# Patient Record
Sex: Female | Born: 1955 | Race: White | Hispanic: No | Marital: Married | Smoking: Never smoker
Health system: Southern US, Community
[De-identification: ages and names within clinical notes are randomized; demographics above are authoritative.]

## PROBLEM LIST (undated history)

## (undated) DIAGNOSIS — I341 Nonrheumatic mitral (valve) prolapse: Secondary | ICD-10-CM

## (undated) DIAGNOSIS — R011 Cardiac murmur, unspecified: Secondary | ICD-10-CM

## (undated) DIAGNOSIS — K219 Gastro-esophageal reflux disease without esophagitis: Secondary | ICD-10-CM

## (undated) DIAGNOSIS — M1990 Unspecified osteoarthritis, unspecified site: Secondary | ICD-10-CM

## (undated) DIAGNOSIS — E785 Hyperlipidemia, unspecified: Secondary | ICD-10-CM

## (undated) DIAGNOSIS — I10 Essential (primary) hypertension: Secondary | ICD-10-CM

## (undated) DIAGNOSIS — Z90710 Acquired absence of both cervix and uterus: Secondary | ICD-10-CM

## (undated) HISTORY — DX: Gastro-esophageal reflux disease without esophagitis: K21.9

## (undated) HISTORY — DX: Essential (primary) hypertension: I10

## (undated) HISTORY — DX: Nonrheumatic mitral (valve) prolapse: I34.1

## (undated) HISTORY — DX: Hyperlipidemia, unspecified: E78.5

## (undated) HISTORY — DX: Unspecified osteoarthritis, unspecified site: M19.90

## (undated) HISTORY — DX: Acquired absence of both cervix and uterus: Z90.710

## (undated) HISTORY — DX: Cardiac murmur, unspecified: R01.1

## (undated) HISTORY — PX: CHOLECYSTECTOMY: SHX55

---

## 1993-08-25 DIAGNOSIS — Z90710 Acquired absence of both cervix and uterus: Secondary | ICD-10-CM

## 1993-08-25 HISTORY — DX: Acquired absence of both cervix and uterus: Z90.710

## 1993-08-25 HISTORY — PX: ABDOMINAL HYSTERECTOMY: SHX81

## 2007-05-04 ENCOUNTER — Ambulatory Visit: Payer: Self-pay | Admitting: Family Medicine

## 2007-05-04 DIAGNOSIS — N951 Menopausal and female climacteric states: Secondary | ICD-10-CM

## 2007-05-04 DIAGNOSIS — I341 Nonrheumatic mitral (valve) prolapse: Secondary | ICD-10-CM | POA: Insufficient documentation

## 2007-05-04 DIAGNOSIS — I10 Essential (primary) hypertension: Secondary | ICD-10-CM | POA: Insufficient documentation

## 2007-05-13 ENCOUNTER — Encounter: Admission: RE | Admit: 2007-05-13 | Discharge: 2007-05-13 | Payer: Self-pay | Admitting: Family Medicine

## 2007-07-05 ENCOUNTER — Telehealth: Payer: Self-pay | Admitting: Family Medicine

## 2007-07-20 ENCOUNTER — Encounter: Payer: Self-pay | Admitting: Family Medicine

## 2007-07-20 LAB — CONVERTED CEMR LAB
CO2: 20 meq/L (ref 19–32)
Cholesterol: 246 mg/dL — ABNORMAL HIGH (ref 0–200)
Creatinine, Ser: 0.71 mg/dL (ref 0.40–1.20)
Glucose, Bld: 89 mg/dL (ref 70–99)
HDL: 45 mg/dL (ref >39)
Total Bilirubin: 1.3 mg/dL — ABNORMAL HIGH (ref 0.3–1.2)
Total CHOL/HDL Ratio: 5.5
Triglycerides: 205 mg/dL — ABNORMAL HIGH (ref <150)
VLDL: 41 mg/dL — ABNORMAL HIGH (ref 0–40)

## 2007-07-26 ENCOUNTER — Ambulatory Visit: Payer: Self-pay | Admitting: Family Medicine

## 2007-07-26 DIAGNOSIS — E785 Hyperlipidemia, unspecified: Secondary | ICD-10-CM | POA: Insufficient documentation

## 2007-12-13 ENCOUNTER — Ambulatory Visit: Payer: Self-pay | Admitting: Family Medicine

## 2007-12-13 DIAGNOSIS — K589 Irritable bowel syndrome without diarrhea: Secondary | ICD-10-CM | POA: Insufficient documentation

## 2007-12-14 LAB — CONVERTED CEMR LAB
Albumin: 4.6 g/dL (ref 3.5–5.2)
BUN: 19 mg/dL (ref 6–23)
Calcium: 9.6 mg/dL (ref 8.4–10.5)
Chloride: 104 meq/L (ref 96–112)
Glucose, Bld: 99 mg/dL (ref 70–99)
HDL: 41 mg/dL (ref >39)
Potassium: 3.8 meq/L (ref 3.5–5.3)
Triglycerides: 216 mg/dL — ABNORMAL HIGH (ref <150)

## 2008-03-03 ENCOUNTER — Ambulatory Visit: Payer: Self-pay | Admitting: Family Medicine

## 2008-06-19 ENCOUNTER — Ambulatory Visit: Payer: Self-pay | Admitting: Family Medicine

## 2008-09-19 ENCOUNTER — Ambulatory Visit: Payer: Self-pay | Admitting: Family Medicine

## 2008-09-19 LAB — CONVERTED CEMR LAB: LDL Goal: 130 mg/dL

## 2008-09-20 LAB — CONVERTED CEMR LAB
ALT: 31 units/L (ref 0–35)
Albumin: 4.6 g/dL (ref 3.5–5.2)
CO2: 23 meq/L (ref 19–32)
Calcium: 9.7 mg/dL (ref 8.4–10.5)
Chloride: 101 meq/L (ref 96–112)
Creatinine, Ser: 0.83 mg/dL (ref 0.40–1.20)
Potassium: 4.1 meq/L (ref 3.5–5.3)
Triglycerides: 258 mg/dL — ABNORMAL HIGH (ref <150)

## 2008-10-30 ENCOUNTER — Encounter: Admission: RE | Admit: 2008-10-30 | Discharge: 2008-10-30 | Payer: Self-pay | Admitting: Family Medicine

## 2009-09-20 ENCOUNTER — Ambulatory Visit: Payer: Self-pay | Admitting: Family Medicine

## 2009-09-21 ENCOUNTER — Encounter (INDEPENDENT_AMBULATORY_CARE_PROVIDER_SITE_OTHER): Payer: Self-pay | Admitting: *Deleted

## 2009-09-21 LAB — CONVERTED CEMR LAB
ALT: 24 units/L (ref 0–35)
AST: 22 units/L (ref 0–37)
Alkaline Phosphatase: 56 units/L (ref 39–117)
Calcium: 9.9 mg/dL (ref 8.4–10.5)
Chloride: 105 meq/L (ref 96–112)
Creatinine, Ser: 0.77 mg/dL (ref 0.40–1.20)
HDL: 41 mg/dL (ref >39)
LDL Cholesterol: 120 mg/dL — ABNORMAL HIGH (ref 0–99)
TSH: 2.082 microintl units/mL (ref 0.350–4.500)
Total Bilirubin: 1.2 mg/dL (ref 0.3–1.2)
Total CHOL/HDL Ratio: 5.1
VLDL: 50 mg/dL — ABNORMAL HIGH (ref 0–40)
Vit D, 25-Hydroxy: 26 ng/mL — ABNORMAL LOW (ref 30–89)

## 2010-01-02 ENCOUNTER — Encounter: Payer: Self-pay | Admitting: Family Medicine

## 2010-03-18 ENCOUNTER — Ambulatory Visit: Payer: Self-pay | Admitting: Family Medicine

## 2010-03-18 DIAGNOSIS — R110 Nausea: Secondary | ICD-10-CM

## 2010-08-22 ENCOUNTER — Telehealth: Payer: Self-pay | Admitting: Family Medicine

## 2010-08-27 ENCOUNTER — Telehealth: Payer: Self-pay | Admitting: Family Medicine

## 2010-09-24 NOTE — Letter (Signed)
Summary: Out of Work  [facility]  [address], [address]   [place], [place] [address]   Phone: [phone]  Fax: [phone]    March 18, 2010   Employee:  Lorelee Cover    To Whom It May Concern:   For Medical reasons, please excuse the above named employee from work for the following dates:  Start:   03-17-2010  End:   03-19-2010  If you need additional information, please feel free to contact our office.         Sincerely,    Nani Gasser MD

## 2010-09-24 NOTE — Letter (Signed)
Summary: Primary Care Consult Scheduled Letter  [place] [facility]  [address]   [place], [place] [address]   Phone: [phone]  Fax: [phone]      09/21/2009 MRN: [id number]  [facility] [address] [place], [place]  [address]    Dear Ms. Ventrella,      We have scheduled an appointment for you.  At the recommendation of Dr.METHENEY , we have scheduled you a consult with [facility], [place]  DR Jason Fila FOR YOU COLONOSCOPY  on December 13 2009 at 8AM.  Their address is_[address], [place] . The office phone number is [phone].  If this appointment day and time is not convenient for you, please feel free to call the office of the doctor you are being referred to at the number listed above and reschedule the appointment.     It is important for you to keep your scheduled appointments. We are here to make sure you are given good patient care. If you have questions or you have made changes to your appointment, please notify us at  [phone], ask for HELEN.    Thank you,  Patient Care Coordinator [place] [facility]

## 2010-09-24 NOTE — Letter (Signed)
Summary: Patient Cancellation/[facility]  Patient Cancellation/[facility]   Imported By: Lanelle Bal 02/21/2010 08:30:36  _____________________________________________________________________  External Attachment:    Type:   Image     Comment:   External Document

## 2010-09-24 NOTE — Assessment & Plan Note (Signed)
Summary: HTN, nausea   Vital Signs:  Patient profile:   55 year old female Height:      64 inches Weight:      220 pounds Pulse rate:   78 / minute BP sitting:   153 / 91  (left arm) Cuff size:   large  Vitals Entered By: Kathlene November (March 18, 2010 1:24 PM)  Serial Vital Signs/Assessments:  Time      Position  BP       Pulse  Resp  Temp     By 1:35 PM             132/90                         Nani Gasser MD   CC: check up BP   Primary Care Provider:  Linford Arnold, C  CC:  check up BP.  History of Present Illness: Has been very stressed and then on Sunday was nauseated  so missed work. No fever or abdominal problems.   Works in a Surveyor, mining. Needs a doctors note.  Nausea is better today. Trying to stay hydrated. Took BP pills last night.  Does have a hx of IBS. No diarrhea.    BP med well tolerated. No SE.  Takes regularly.   Current Medications (verified): 1)  Lisinopril-Hydrochlorothiazide 10-12.5 Mg  Tabs (Lisinopril-Hydrochlorothiazide) .... Take One Tablet By Mouth Once A Day 2)  Propranolol Hcl Cr 80 Mg Xr24h-Cap (Propranolol Hcl) .... Take 1 Tablet By Mouth Once A Day 3)  Calcium 500/d 500-200 Mg-Unit  Tabs (Calcium Carbonate-Vitamin D) .... Take 1 Tablet By Mouth Two Times A Day 4)  Multivitamins   Tabs (Multiple Vitamin) .... Take 1 Tablet By Mouth Once A Day 5)  Simvastatin 40 Mg Tabs (Simvastatin) .... Take 1 Tablet By Mouth Once A Day At Bedtime 6)  Eql Fish Oil 1000 Mg  Caps (Omega-3 Fatty Acids) .... 2 Tabs Two Times A Day  Allergies (verified): No Known Drug Allergies  Comments:  Nurse/Medical Assistant: The patient's medications and allergies were reviewed with the patient and were updated in the Medication and Allergy Lists. Kathlene November (March 18, 2010 1:25 PM)  Physical Exam  General:  Well-developed,well-nourished,in no acute distress; alert,appropriate and cooperative throughout examination Head:  Normocephalic and atraumatic without obvious  abnormalities. No apparent alopecia or balding. Lungs:  Normal respiratory effort, chest expands symmetrically. Lungs are clear to auscultation, no crackles or wheezes. Heart:  Normal rate and regular rhythm. S1 and S2 normal without gallop, murmur, click, rub or other extra sounds. Abdomen:  Bowel sounds positive,abdomen soft and non-tender without masses, organomegaly or hernias noted. Skin:  no rashes.   Cervical Nodes:  No lymphadenopathy noted Psych:  Cognition and judgment appear intact. Alert and cooperative with normal attention span and concentration. No apparent delusions, illusions, hallucinations   Impression & Recommendations:  Problem # 1:  HYPERTENSION, BENIGN ESSENTIAL (ICD-401.1) Repeat BP much better. Recheck in 3 months since systolic a little higher than recommended.  Work on stress levels. Continue to work on diet and exercise.  Her updated medication list for this problem includes:    Lisinopril-hydrochlorothiazide 10-12.5 Mg Tabs (Lisinopril-hydrochlorothiazide) .Marland Kitchen... Take one tablet by mouth once a day    Propranolol Hcl Cr 80 Mg Xr24h-cap (Propranolol hcl) .Marland Kitchen... Take 1 tablet by mouth once a day  BP today: 153/91 Prior BP: 125/78 (09/20/2009)  Prior 10 Yr Risk Heart Disease: 7 % (09/19/2008)  Labs  Reviewed: K+: 4.0 (09/20/2009) Creat: : 0.77 (09/20/2009)   Chol: 211 (09/20/2009)   HDL: 41 (09/20/2009)   LDL: 120 (09/20/2009)   TG: 249 (09/20/2009)  Problem # 2:  NAUSEA (ICD-787.02) sxs resolved. Normal exam today. Work note given.   Complete Medication List: 1)  Lisinopril-hydrochlorothiazide 10-12.5 Mg Tabs (Lisinopril-hydrochlorothiazide) .... Take one tablet by mouth once a day 2)  Propranolol Hcl Cr 80 Mg Xr24h-cap (Propranolol hcl) .... Take 1 tablet by mouth once a day 3)  Calcium 500/d 500-200 Mg-unit Tabs (Calcium carbonate-vitamin d) .... Take 1 tablet by mouth two times a day 4)  Multivitamins Tabs (Multiple vitamin) .... Take 1 tablet by mouth once  a day 5)  Simvastatin 40 Mg Tabs (Simvastatin) .... Take 1 tablet by mouth once a day at bedtime 6)  Eql Fish Oil 1000 Mg Caps (Omega-3 fatty acids) .... 2 tabs two times a day  Patient Instructions: 1)  Please schedule a follow-up appointment in 3 months to recheck BP.

## 2010-09-24 NOTE — Assessment & Plan Note (Signed)
Summary: CPE, no pap   Vital Signs:  Patient profile:   55 year old female Height:      64 inches Weight:      220 pounds BMI:     37.90 Pulse rate:   68 / minute BP sitting:   125 / 78  (left arm) Cuff size:   large  Vitals Entered By: Kathlene November (September 20, 2009 10:35 AM) CC: CPE no pap   CC:  CPE no pap.  Current Medications (verified): 1)  Lisinopril-Hydrochlorothiazide 10-12.5 Mg  Tabs (Lisinopril-Hydrochlorothiazide) .... Take One Tablet By Mouth Once A Day 2)  Propranolol Hcl Cr 80 Mg Xr24h-Cap (Propranolol Hcl) .... Take 1 Tablet By Mouth Once A Day 3)  Calcium 500/d 500-200 Mg-Unit  Tabs (Calcium Carbonate-Vitamin D) .... Take 1 Tablet By Mouth Two Times A Day 4)  Multivitamins   Tabs (Multiple Vitamin) .... Take 1 Tablet By Mouth Once A Day 5)  Simvastatin 20 Mg  Tabs (Simvastatin) .... Take 1 Tablet By Mouth Once A Day At Bedtime 6)  Eql Fish Oil 1000 Mg  Caps (Omega-3 Fatty Acids) .... 2 Tabs Two Times A Day  Allergies (verified): No Known Drug Allergies  Comments:  Nurse/Medical Assistant: The patient's medications and allergies were reviewed with the patient and were updated in the Medication and Allergy Lists. Kathlene November (September 20, 2009 10:36 AM)  Past History:  Past Surgical History: Last updated: 05/04/2007 Complete Hysterectomy 1995 secondary to fibroid tumors Cholecystectomy April 26, 1994  Family History: Mother died at 67 ofblood clots from birth control Aunts with DM Sister with cholesterol, non alcoholic fatty cirrhosis or the liver.  Brother died age 6 from drug overdose.  Father with prostate cancer.   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  Married to [place].  No children.  Husband is a smoker.  Never Smoked Alcohol use-yes Drug use-no Regular exercise-no  Review of Systems  The patient denies anorexia, fever, weight loss, weight gain, vision loss, decreased hearing, hoarseness, chest pain, syncope, dyspnea  on exertion, peripheral edema, prolonged cough, headaches, hemoptysis, abdominal pain, melena, hematochezia, severe indigestion/heartburn, hematuria, incontinence, genital sores, muscle weakness, suspicious skin lesions, transient blindness, difficulty walking, depression, unusual weight change, abnormal bleeding, enlarged lymph nodes, and breast masses.    Physical Exam  General:  Well-developed,well-nourished,in no acute distress; alert,appropriate and cooperative throughout examination Head:  Normocephalic and atraumatic without obvious abnormalities. No apparent alopecia or balding. Eyes:  No corneal or conjunctival inflammation noted. EOMI. Perrla. Ears:  External ear exam shows no significant lesions or deformities.  Otoscopic examination reveals clear canals, tympanic membranes are intact bilaterally without bulging, retraction, inflammation or discharge. Hearing is grossly normal bilaterally. Nose:  External nasal examination shows no deformity or inflammation.  Mouth:  Oral mucosa and oropharynx without lesions or exudates.  Teeth in good repair. Neck:  No deformities, masses, or tenderness noted. Breasts:  No mass, nodules, thickening, tenderness, bulging, retraction, inflamation, nipple discharge or skin changes noted.   Lungs:  Normal respiratory effort, chest expands symmetrically. Lungs are clear to auscultation, no crackles or wheezes. Heart:  Normal rate and regular rhythm. S1 and S2 normal without gallop, murmur, click, rub or other extra sounds. Abdomen:  Bowel sounds positive,abdomen soft and non-tender without masses, organomegaly or hernias noted. Msk:  No deformity or scoliosis noted of thoracic or lumbar spine.   Pulses:  R and L carotid,radial,dorsalis pedis and posterior tibial pulses are full and equal bilaterally Extremities:  No  clubbing, cyanosis, edema, or deformity noted with normal full range of motion of all joints.   Neurologic:  No cranial nerve deficits noted.  Station and gait are normal.  DTRs are symmetrical throughout. Sensory, motor and coordinative functions appear intact. Skin:  no rashes.   Cervical Nodes:  No lymphadenopathy noted Axillary Nodes:  No palpable lymphadenopathy Psych:  Cognition and judgment appear intact. Alert and cooperative with normal attention span and concentration. No apparent delusions, illusions, hallucinations   Impression & Recommendations:  Problem # 1:  ROUTINE GENERAL MEDICAL EXAM@HEALTH  CARE FACL (ICD-V70.0) DEclined flu vaccine. Others are up to date.  Will refer to [facility] for colonoscopy for colon Ca Screening.  Due for screenign labs Refilled her BP medication Encouraged more regular exericise.  Orders: T-Comprehensive Metabolic Panel [phone]) T-Lipid Profile [phone]) T-TSH [phone]) T-Vitamin D (25-Hydroxy) [phone])  Complete Medication List: 1)  Lisinopril-hydrochlorothiazide 10-12.5 Mg Tabs (Lisinopril-hydrochlorothiazide) .... Take one tablet by mouth once a day 2)  Propranolol Hcl Cr 80 Mg Xr24h-cap (Propranolol hcl) .... Take 1 tablet by mouth once a day 3)  Calcium 500/d 500-200 Mg-unit Tabs (Calcium carbonate-vitamin d) .... Take 1 tablet by mouth two times a day 4)  Multivitamins Tabs (Multiple vitamin) .... Take 1 tablet by mouth once a day 5)  Simvastatin 20 Mg Tabs (Simvastatin) .... Take 1 tablet by mouth once a day at bedtime 6)  Eql Fish Oil 1000 Mg Caps (Omega-3 fatty acids) .... 2 tabs two times a day  Other Orders: Gastroenterology Referral (GI)  Patient Instructions: 1)  Dont forget you are due for your mammogram in March. 2)  Labs need to be fasting.  3)  Take calcium +vitamin D daily.  4)  It is important that you exercise reguarly at least 20 minutes 5 times a week. If you develop chest pain, have severe difficulty breathing, or feel very tired, stop exercising immediately and seek medical attention.  Prescriptions: SIMVASTATIN 20 MG   TABS (SIMVASTATIN) Take 1 tablet by mouth once a day at bedtime  #90 x 3   Entered and Authorized by:   Nani Gasser MD   Signed by:   Nani Gasser MD on 09/20/2009   Method used:   Electronically to        [facility] [phone]* (retail)       [address]       [place], [place]  [address]       Ph: [phone]       Fax: [phone]   RxID:   [id number] PROPRANOLOL HCL CR 80 MG XR24H-CAP (PROPRANOLOL HCL) Take 1 tablet by mouth once a day  #90 x 3   Entered and Authorized by:   Nani Gasser MD   Signed by:   Nani Gasser MD on 09/20/2009   Method used:   Electronically to        [facility] [phone]* (retail)       [address]       [place], [place]  [address]       Ph: [phone]       Fax: [phone]   RxID:   [id number] LISINOPRIL-HYDROCHLOROTHIAZIDE 10-12.5 MG  TABS (LISINOPRIL-HYDROCHLOROTHIAZIDE) Take one tablet by mouth once a day  #90 x 3   Entered and Authorized by:   Nani Gasser MD   Signed by:   Nani Gasser MD on 09/20/2009   Method used:   Electronically to        [facility] [phone]* (retail)       [address] [place], [place]  [address]       Ph: [phone]       Fax: [phone]   RxID:   [id number]   Flu Vaccine Next Due:  Refused TD Result Date:  08/26/2003 TD Result:  given TD Next Due:  10 yr PAP Next Due:  Not Indicated

## 2010-09-26 NOTE — Progress Notes (Signed)
  Phone Note Refill Request Message from:  Pharmacy on August 27, 2010 11:54 AM  Refills Requested: Medication #1:  LISINOPRIL-HYDROCHLOROTHIAZIDE 10-12.5 MG  TABS Take one tablet by mouth once a day   Supply Requested: 3 months Denied. Pt needs a f/u appt. Just sent rx 08/22/10.Pt has not been seen since 7/11  Initial call taken by: Avon Gully CMA, Duncan Dull),  August 27, 2010 11:54 AM

## 2010-09-26 NOTE — Progress Notes (Signed)
  Phone Note Refill Request Message from:  Pharmacy on August 22, 2010 2:34 PM  Refills Requested: Medication #1:  LISINOPRIL-HYDROCHLOROTHIAZIDE 10-12.5 MG  TABS Take one tablet by mouth once a day will send 30 to pharm but pt needs a f/u appt. has not been seen since 7/11   Method Requested: Fax to Local Pharmacy Initial call taken by: Avon Gully CMA, Duncan Dull),  August 22, 2010 2:38 PM Caller: [facility] [phone]*    Prescriptions: LISINOPRIL-HYDROCHLOROTHIAZIDE 10-12.5 MG  TABS (LISINOPRIL-HYDROCHLOROTHIAZIDE) Take one tablet by mouth once a day  #30 x 0   Entered by:   Avon Gully CMA, (AAMA)   Authorized by:   Nani Gasser MD   Signed by:   Avon Gully CMA, (AAMA) on 08/22/2010   Method used:   Electronically to        [facility] [phone]* (retail)       [address] [place], [place]  [address]       Ph: [phone]       Fax: [phone]   RxID:   [id number]

## 2010-12-05 ENCOUNTER — Other Ambulatory Visit: Payer: Self-pay | Admitting: Family Medicine

## 2010-12-05 ENCOUNTER — Other Ambulatory Visit: Payer: Self-pay | Admitting: *Deleted

## 2010-12-05 MED ORDER — PROPRANOLOL HCL ER 80 MG PO CP24: 80.0000 mg | ORAL_CAPSULE | Freq: Every day | ORAL | Status: DC

## 2010-12-05 MED ORDER — LISINOPRIL-HYDROCHLOROTHIAZIDE 10-12.5 MG PO TABS: 1.0000 | ORAL_TABLET | Freq: Every day | ORAL | Status: DC

## 2010-12-05 MED ORDER — SIMVASTATIN 20 MG PO TABS: 20.0000 mg | ORAL_TABLET | Freq: Every evening | ORAL | Status: DC

## 2011-01-02 ENCOUNTER — Ambulatory Visit: Payer: Self-pay | Admitting: Family Medicine

## 2011-03-29 ENCOUNTER — Encounter: Payer: Self-pay | Admitting: Family Medicine

## 2011-04-01 ENCOUNTER — Encounter: Payer: Self-pay | Admitting: Family Medicine

## 2011-04-01 ENCOUNTER — Ambulatory Visit (INDEPENDENT_AMBULATORY_CARE_PROVIDER_SITE_OTHER): Payer: BC Managed Care – PPO | Admitting: Family Medicine

## 2011-04-01 VITALS — BP 125/76 | HR 77 | Ht 64.0 in | Wt 223.0 lb

## 2011-04-01 DIAGNOSIS — E785 Hyperlipidemia, unspecified: Secondary | ICD-10-CM

## 2011-04-01 DIAGNOSIS — I10 Essential (primary) hypertension: Secondary | ICD-10-CM

## 2011-04-01 DIAGNOSIS — Z1231 Encounter for screening mammogram for malignant neoplasm of breast: Secondary | ICD-10-CM

## 2011-04-01 DIAGNOSIS — Z1211 Encounter for screening for malignant neoplasm of colon: Secondary | ICD-10-CM

## 2011-04-01 MED ORDER — LOSARTAN POTASSIUM-HCTZ 50-12.5 MG PO TABS: 1.0000 | ORAL_TABLET | Freq: Every day | ORAL | Status: DC

## 2011-04-01 NOTE — Assessment & Plan Note (Signed)
Her blood pressure is well controlled today. We will continue her current regimen. She is due for lab work which is going for today. Followup in 6 months. Because she complains of a dry cough it certainly could be induced from the ACE inhibitor. We will change to less certain status improves in the next month. If it does not then consider that this could be from reflux. This he recently started taking over-the-counter Prilosec.

## 2011-04-01 NOTE — Assessment & Plan Note (Signed)
Dong well. BP well controlled. Due for labs.  Will change to losartan and see if cough gets better. If not better in one month call the office as it may be reflux, though she recently started prilosecOTC. Will get CXR if cough persists.

## 2011-04-01 NOTE — Assessment & Plan Note (Signed)
2 to recheck lipids today. Lab slip given. We will adjust her medication if needed. She is tolerating it well without any side effects.

## 2011-04-01 NOTE — Progress Notes (Signed)
  Subjective:    Patient ID: Shannon [name], female    DOB: Sep 29, 1955, 55 y.o.   MRN: [id number]  Hypertension This is a chronic problem. The current episode started more than 1 year ago. The problem is controlled. Pertinent negatives include no chest pain, headaches, peripheral edema or shortness of breath. There are no associated agents to hypertension. Risk factors for coronary artery disease include no known risk factors. Past treatments include ACE inhibitors, diuretics and beta blockers. There are no compliance problems.   Hyperlipidemia The current episode started more than 1 month ago. Recent lipid tests were reviewed and are high. Factors aggravating her hyperlipidemia include beta blockers. Pertinent negatives include no chest pain or shortness of breath. Current antihyperlipidemic treatment includes statins. There are no compliance problems.    Recently started having reflux and started OtC Prilosec 12 days ago.  Says often has a dry cough and thinks may be from her BP pill. Cough is mostly after eating. It is dry. No SOB.     Review of Systems  Respiratory: Negative for shortness of breath.   Cardiovascular: Negative for chest pain.  Neurological: Negative for headaches.       Objective:   Physical Exam  Constitutional: She is oriented to person, place, and time. She appears well-developed and well-nourished.  HENT:  Head: Normocephalic and atraumatic.  Cardiovascular: Normal rate, regular rhythm and normal heart sounds.        No carotid bruits.   Pulmonary/Chest: Effort normal and breath sounds normal.  Musculoskeletal: She exhibits no edema.  Neurological: She is alert and oriented to person, place, and time.  Skin: Skin is warm and dry.  Psychiatric: She has a normal mood and affect. Her behavior is normal. Thought content normal.          Assessment & Plan:  We also discussed the importance of getting her mammogram and her colonoscopy. She said she never went  for colonoscopy referral that we did last year, because of her work schedule

## 2011-04-02 LAB — LIPID PANEL
Cholesterol: 193 mg/dL (ref 0–200)
Total CHOL/HDL Ratio: 4.4 Ratio
Triglycerides: 249 mg/dL — ABNORMAL HIGH (ref <150)
VLDL: 50 mg/dL — ABNORMAL HIGH (ref 0–40)

## 2011-04-03 ENCOUNTER — Telehealth: Payer: Self-pay | Admitting: Family Medicine

## 2011-04-03 LAB — COMPLETE METABOLIC PANEL WITHOUT GFR
ALT: 22 U/L (ref 0–35)
AST: 20 U/L (ref 0–37)
Albumin: 4.1 g/dL (ref 3.5–5.2)
Alkaline Phosphatase: 59 U/L (ref 39–117)
BUN: 16 mg/dL (ref 6–23)
CO2: 22 meq/L (ref 19–32)
Calcium: 8.9 mg/dL (ref 8.4–10.5)
Chloride: 108 meq/L (ref 96–112)
Creat: 0.64 mg/dL (ref 0.50–1.10)
GFR, Est African American: >60 mL/min
GFR, Est Non African American: >60 mL/min
Glucose, Bld: 103 mg/dL — ABNORMAL HIGH (ref 70–99)
Potassium: 4.4 meq/L (ref 3.5–5.3)
Sodium: 139 meq/L (ref 135–145)
Total Bilirubin: 0.6 mg/dL (ref 0.3–1.2)
Total Protein: 6.5 g/dL (ref 6.0–8.3)

## 2011-04-03 NOTE — Telephone Encounter (Signed)
Call patient: Complete metabolic panel looks great except sugar is still borderline but this is unchanged. Also cholesterol looks much better this time. Her LDL is finally at goal of 99. The tryglycerides are still elevated I would like her to increase her fish oil to 4 times daily if she can tolerate this. She can also add flaxseed to her diet.

## 2011-04-03 NOTE — Telephone Encounter (Signed)
LM on voicemail to return call for results. KJ LPN

## 2011-04-07 NOTE — Telephone Encounter (Signed)
Called and left message to call back.

## 2011-04-09 NOTE — Telephone Encounter (Signed)
Will mail letter.  

## 2011-04-15 ENCOUNTER — Encounter: Payer: Self-pay | Admitting: *Deleted

## 2011-07-07 ENCOUNTER — Other Ambulatory Visit: Payer: Self-pay | Admitting: *Deleted

## 2011-07-07 MED ORDER — PROPRANOLOL HCL ER 80 MG PO CP24: 80.0000 mg | ORAL_CAPSULE | Freq: Every day | ORAL | Status: DC

## 2011-12-12 ENCOUNTER — Other Ambulatory Visit: Payer: Self-pay | Admitting: Family Medicine

## 2012-02-15 ENCOUNTER — Other Ambulatory Visit: Payer: Self-pay | Admitting: Family Medicine

## 2012-03-11 ENCOUNTER — Other Ambulatory Visit: Payer: Self-pay | Admitting: Family Medicine

## 2012-03-11 NOTE — Telephone Encounter (Signed)
MUST MAKE APPOINTMENT 

## 2012-04-10 ENCOUNTER — Other Ambulatory Visit: Payer: Self-pay | Admitting: Family Medicine

## 2012-04-12 ENCOUNTER — Other Ambulatory Visit: Payer: Self-pay | Admitting: Family Medicine

## 2012-07-28 ENCOUNTER — Other Ambulatory Visit: Payer: Self-pay | Admitting: Family Medicine

## 2012-08-20 ENCOUNTER — Other Ambulatory Visit: Payer: Self-pay | Admitting: Family Medicine

## 2012-09-17 ENCOUNTER — Encounter: Payer: Self-pay | Admitting: Family Medicine

## 2012-09-17 ENCOUNTER — Ambulatory Visit (INDEPENDENT_AMBULATORY_CARE_PROVIDER_SITE_OTHER): Payer: BC Managed Care – PPO | Admitting: Family Medicine

## 2012-09-17 VITALS — BP 159/99 | HR 98 | Resp 18 | Wt 219.0 lb

## 2012-09-17 DIAGNOSIS — I10 Essential (primary) hypertension: Secondary | ICD-10-CM

## 2012-09-17 DIAGNOSIS — E785 Hyperlipidemia, unspecified: Secondary | ICD-10-CM

## 2012-09-17 MED ORDER — LOSARTAN POTASSIUM-HCTZ 50-12.5 MG PO TABS: 1.0000 | ORAL_TABLET | Freq: Every day | ORAL | Status: DC

## 2012-09-17 NOTE — Progress Notes (Signed)
  Subjective:    Patient ID: Shannon [name], female    DOB: Mar 04, 1956, 57 y.o.   MRN: [id number]  HPI Hypertension-she's not been our office and as to year and half. Affect is been a little longer than that. She denies a chest pain, shortness breath, dizziness or other symptoms. She in fact has been out of her blood pressure pills for a month because we refused to refill them since it had been so long since she had an appointment. Otherwise she was tolerating them well without any side effects and does request a refill today.  Hyperlipidemia-she's also been out of her statin for over a month. She says she really wants to work on low carb diet and try to see if she can get her cholesterol down with diet instead of medication. I do think it is reasonable.   Review of Systems     Objective:   Physical Exam  Constitutional: She is oriented to person, place, and time. She appears well-developed and well-nourished.  HENT:  Head: Normocephalic and atraumatic.  Cardiovascular: Normal rate, regular rhythm and normal heart sounds.   Pulmonary/Chest: Effort normal and breath sounds normal.  Neurological: She is alert and oriented to person, place, and time.  Skin: Skin is warm and dry.  Psychiatric: She has a normal mood and affect. Her behavior is normal.          Assessment & Plan:  Hypertension-uncontrolled off medications x1 month. I did refill her medicine and asked her to followup in 6 weeks to make sure that her blood pressures well controlled on her current regimen. She's not fasting today so we'll get a CMP but I do need to get a fasting lipid panel on her in the future.  Hyperlipidemia-I. do think it's reasonable for her to work on diet and exercise and weight loss to see if she can bring her cholesterol down on her own. We discussed setting a goal of 3-4 months at that point rechecking her lipids. She is still not at goal then we will need to restart her statin.  Obesity - Discused  low carb diet and need for exercise.

## 2012-09-18 LAB — COMPLETE METABOLIC PANEL WITH GFR
AST: 20 U/L (ref 0–37)
Alkaline Phosphatase: 71 U/L (ref 39–117)
BUN: 19 mg/dL (ref 6–23)
Creat: 0.97 mg/dL (ref 0.50–1.10)
GFR, Est African American: 77 mL/min
Glucose, Bld: 94 mg/dL (ref 70–99)

## 2012-09-18 NOTE — Progress Notes (Signed)
Quick Note:  All labs are normal. ______ 

## 2012-09-30 ENCOUNTER — Other Ambulatory Visit: Payer: Self-pay | Admitting: Family Medicine

## 2012-10-01 NOTE — Telephone Encounter (Signed)
I can't tell if she has been taken off of this or not

## 2012-10-29 ENCOUNTER — Ambulatory Visit: Payer: BC Managed Care – PPO | Admitting: Family Medicine

## 2012-11-12 ENCOUNTER — Ambulatory Visit: Payer: BC Managed Care – PPO | Admitting: Family Medicine

## 2012-11-19 ENCOUNTER — Ambulatory Visit (INDEPENDENT_AMBULATORY_CARE_PROVIDER_SITE_OTHER): Payer: BC Managed Care – PPO | Admitting: Family Medicine

## 2012-11-19 ENCOUNTER — Encounter: Payer: Self-pay | Admitting: Family Medicine

## 2012-11-19 VITALS — BP 139/88 | HR 71 | Wt 214.0 lb

## 2012-11-19 DIAGNOSIS — E785 Hyperlipidemia, unspecified: Secondary | ICD-10-CM

## 2012-11-19 DIAGNOSIS — I10 Essential (primary) hypertension: Secondary | ICD-10-CM

## 2012-11-19 DIAGNOSIS — Z1231 Encounter for screening mammogram for malignant neoplasm of breast: Secondary | ICD-10-CM

## 2012-11-19 NOTE — Progress Notes (Signed)
  Subjective:    Patient ID: Shannon [name], female    DOB: 30-Sep-1955, 57 y.o.   MRN: [id number]  HPI HTN -  Pt denies chest pain, SOB, dizziness, or heart palpitations.  Taking meds as directed w/o problems.  Denies medication side effects.    Walked out on her job last Tuesday of 18 year.  O/W feeling well.  Denies feeling depressed or sad. Says feels quite relieved.    Hyperlipidemia-she is a cluster was probably elevated as she is overweight but she has been walking on it. She cut out bread and has actually lost 5 more pounds since I last saw her.   Review of Systems     Objective:   Physical Exam  Constitutional: She is oriented to person, place, and time. She appears well-developed and well-nourished.  HENT:  Head: Normocephalic and atraumatic.  Cardiovascular: Normal rate, regular rhythm and normal heart sounds.   Pulmonary/Chest: Effort normal and breath sounds normal.  Neurological: She is alert and oriented to person, place, and time.  Skin: Skin is warm and dry.  Psychiatric: She has a normal mood and affect. Her behavior is normal.          Assessment & Plan:  HTN- Well controlled.  F/U in 6 months.   Hyperlipidemia - Has lost weight, 5 more lbs.  Due to recheck soon.  Continue work on diet and exercise.  Due for screening mammogram-will schedule.  Due for skin colonoscopy. She declined at this time but says she will do stool cards. Set of cards given today.

## 2012-12-02 LAB — COMPLETE METABOLIC PANEL WITH GFR
ALT: 36 U/L — ABNORMAL HIGH (ref 0–35)
Alkaline Phosphatase: 64 U/L (ref 39–117)
CO2: 25 mEq/L (ref 19–32)
Sodium: 139 mEq/L (ref 135–145)
Total Bilirubin: 0.9 mg/dL (ref 0.3–1.2)
Total Protein: 6.7 g/dL (ref 6.0–8.3)

## 2012-12-02 LAB — LIPID PANEL
HDL: 43 mg/dL (ref >39)
LDL Cholesterol: 133 mg/dL — ABNORMAL HIGH (ref 0–99)
Total CHOL/HDL Ratio: 5.9 Ratio

## 2012-12-07 ENCOUNTER — Ambulatory Visit (INDEPENDENT_AMBULATORY_CARE_PROVIDER_SITE_OTHER): Payer: Self-pay

## 2012-12-07 DIAGNOSIS — Z1231 Encounter for screening mammogram for malignant neoplasm of breast: Secondary | ICD-10-CM

## 2012-12-23 ENCOUNTER — Other Ambulatory Visit: Payer: Self-pay | Admitting: Family Medicine

## 2013-01-18 ENCOUNTER — Other Ambulatory Visit: Payer: Self-pay | Admitting: Family Medicine

## 2013-03-31 ENCOUNTER — Other Ambulatory Visit: Payer: Self-pay | Admitting: Family Medicine

## 2013-08-05 ENCOUNTER — Other Ambulatory Visit: Payer: Self-pay | Admitting: Family Medicine

## 2013-09-20 ENCOUNTER — Other Ambulatory Visit: Payer: Self-pay | Admitting: Family Medicine

## 2015-11-30 ENCOUNTER — Encounter: Payer: Self-pay | Admitting: Family Medicine

## 2015-11-30 ENCOUNTER — Ambulatory Visit (INDEPENDENT_AMBULATORY_CARE_PROVIDER_SITE_OTHER): Payer: Managed Care, Other (non HMO) | Admitting: Family Medicine

## 2015-11-30 VITALS — BP 163/85 | HR 83 | Wt 208.0 lb

## 2015-11-30 DIAGNOSIS — I10 Essential (primary) hypertension: Secondary | ICD-10-CM | POA: Diagnosis not present

## 2015-11-30 DIAGNOSIS — Z23 Encounter for immunization: Secondary | ICD-10-CM

## 2015-11-30 DIAGNOSIS — I341 Nonrheumatic mitral (valve) prolapse: Secondary | ICD-10-CM | POA: Diagnosis not present

## 2015-11-30 DIAGNOSIS — Z1159 Encounter for screening for other viral diseases: Secondary | ICD-10-CM

## 2015-11-30 DIAGNOSIS — Z1231 Encounter for screening mammogram for malignant neoplasm of breast: Secondary | ICD-10-CM | POA: Diagnosis not present

## 2015-11-30 NOTE — Progress Notes (Signed)
Subjective:    Patient ID: Shannon [name], female    DOB: 14-Aug-1956, 60 y.o.   MRN: [id number]  HPI  Here to reestablish care. Unfortunately, she switch jobs and was not painful to get new health insurance through her new job until recently. Says she's been without health care for almost 3 years. She has not been taking her blood pressure pill and has not had any lab work. She denies any chest pain shortest breath palpitations or swelling.   Review of Systems  Constitutional: Negative for fever, diaphoresis and unexpected weight change.  HENT: Negative for hearing loss, rhinorrhea and tinnitus.   Eyes: Negative for visual disturbance.  Respiratory: Negative for cough and wheezing.   Cardiovascular: Negative for chest pain and palpitations.  Gastrointestinal: Negative for nausea, vomiting, diarrhea and blood in stool.  Genitourinary: Negative for vaginal bleeding, vaginal discharge and difficulty urinating.  Musculoskeletal: Negative for myalgias and arthralgias.  Skin: Negative for rash.  Neurological: Negative for headaches.  Hematological: Negative for adenopathy. Does not bruise/bleed easily.  Psychiatric/Behavioral: Negative for sleep disturbance and dysphoric mood. The patient is not nervous/anxious.    BP 163/85 mmHg  Pulse 83  Wt 208 lb (94.348 kg)  SpO2 99%    Allergies  Allergen Reactions  . Penicillins     Past Medical History  Diagnosis Date  . History of hysterectomy 1995    Past Surgical History  Procedure Laterality Date  . Abdominal hysterectomy  1995  . Cholecystectomy      Social History   Social History  . Marital Status: Married    Spouse Name: [name].   . Number of Children: N/A  . Years of Education: N/A   Occupational History  . works in [employer]  . Smoking status: Never Smoker   . Smokeless tobacco: Not on file  . Alcohol Use: Yes  . Drug Use: No  . Sexual Activity: Not on file   Other Topics Concern   . Not on file   Social History Narrative    Family History  Problem Relation Age of Onset  . Diabetes type II      aunt  . Hyperlipidemia Sister   . Cirrhosis Sister     non alcoholic fatty cirrhosis of the liver  . Cancer Father     prostate  . Drug abuse Brother   . Colon cancer Sister     Outpatient Encounter Prescriptions as of 11/30/2015  Medication Sig  . Calcium Carbonate-Vitamin D (CALCIUM-VITAMIN D) 500-200 MG-UNIT per tablet Take 1 tablet by mouth 2 (two) times daily with a meal.    . fish oil-omega-3 fatty acids 1000 MG capsule Take 1 g by mouth. Take 2 tabs twice daily.   . Multiple Vitamin (MULTIVITAMIN) tablet Take 1 tablet by mouth daily.    . [DISCONTINUED] losartan-hydrochlorothiazide (HYZAAR) 50-12.5 MG per tablet TAKE ONE TABLET BY MOUTH ONE TIME DAILY  (Patient taking differently: TAKE ONE TABLET BY MOUTH ONE TIME DAILY)  . [DISCONTINUED] propranolol ER (INDERAL LA) 80 MG 24 hr capsule TAKE ONE CAPSULE BY MOUTH ONE TIME DAILY   No facility-administered encounter medications on file as of 11/30/2015.     '     Objective:   Physical Exam  Constitutional: She is oriented to person, place, and time. She appears well-developed and well-nourished.  HENT:  Head: Normocephalic and atraumatic.  Cardiovascular: Normal rate, regular rhythm and normal heart sounds.   Pulmonary/Chest: Effort normal and breath  sounds normal.  Neurological: She is alert and oriented to person, place, and time.  Skin: Skin is warm and dry.  Psychiatric: She has a normal mood and affect. Her behavior is normal.        Assessment & Plan:  Hypertension-will restart her losartan. She would prefer not to be on hydrochlorothiazide. New perception sent to the pharmacy. Encouraged her to schedule a wellness exam in about one month and we can recheck her blood pressure at that time. Next  MVP - she is asymptomatic.    She has never had colon cancer screening. Her sister who is 41 has  stage IV colon cancer. She says she is at least willing to consider doing the color guard but wants to check with her insurance to see if they might cover colonoscopy as well. She will call let us know what she would like to do.  Tdap given today.   Schedule mammogram.

## 2015-12-04 ENCOUNTER — Telehealth: Payer: Self-pay

## 2015-12-04 MED ORDER — LOSARTAN POTASSIUM 50 MG PO TABS: 50.0000 mg | ORAL_TABLET | Freq: Every day | ORAL | Status: DC

## 2015-12-04 NOTE — Telephone Encounter (Signed)
Prescription sent for losartan this morning. As far as the forms are concerned I am not sure. I will forward to [facility].

## 2015-12-06 NOTE — Telephone Encounter (Signed)
Pt informed that forms are incomplete. She will need to complete the patients portion and either mail or she can bring it back for [place] to fax. Maryruth Eve, Lahoma Crocker

## 2015-12-07 LAB — LIPID PANEL
CHOL/HDL RATIO: 5.5 ratio — AB (ref <=5.0)
CHOLESTEROL: 236 mg/dL — AB (ref 125–200)
HDL: 43 mg/dL — AB (ref >=46)
LDL Cholesterol: 134 mg/dL — ABNORMAL HIGH (ref <130)
Triglycerides: 293 mg/dL — ABNORMAL HIGH (ref <150)
VLDL: 59 mg/dL — AB (ref <30)

## 2015-12-07 LAB — COMPLETE METABOLIC PANEL WITH GFR
ALBUMIN: 4.4 g/dL (ref 3.6–5.1)
ALK PHOS: 58 U/L (ref 33–130)
ALT: 23 U/L (ref 6–29)
AST: 21 U/L (ref 10–35)
BUN: 15 mg/dL (ref 7–25)
CALCIUM: 9.2 mg/dL (ref 8.6–10.4)
CO2: 24 mmol/L (ref 20–31)
Chloride: 104 mmol/L (ref 98–110)
Creat: 0.66 mg/dL (ref 0.50–1.05)
Glucose, Bld: 93 mg/dL (ref 65–99)
POTASSIUM: 4.1 mmol/L (ref 3.5–5.3)
Sodium: 138 mmol/L (ref 135–146)
Total Bilirubin: 1.3 mg/dL — ABNORMAL HIGH (ref 0.2–1.2)
Total Protein: 7 g/dL (ref 6.1–8.1)

## 2015-12-07 LAB — TSH: TSH: 1.61 mIU/L

## 2015-12-07 LAB — HEPATITIS C ANTIBODY: HCV Ab: NEGATIVE

## 2015-12-27 ENCOUNTER — Encounter: Payer: Managed Care, Other (non HMO) | Admitting: Family Medicine

## 2016-02-14 ENCOUNTER — Encounter: Payer: Managed Care, Other (non HMO) | Admitting: Family Medicine

## 2016-03-29 ENCOUNTER — Other Ambulatory Visit: Payer: Self-pay | Admitting: Family Medicine

## 2016-04-11 ENCOUNTER — Ambulatory Visit (INDEPENDENT_AMBULATORY_CARE_PROVIDER_SITE_OTHER): Payer: Managed Care, Other (non HMO)

## 2016-04-11 ENCOUNTER — Encounter: Payer: Self-pay | Admitting: Sports Medicine

## 2016-04-11 ENCOUNTER — Ambulatory Visit (INDEPENDENT_AMBULATORY_CARE_PROVIDER_SITE_OTHER): Payer: Managed Care, Other (non HMO) | Admitting: Sports Medicine

## 2016-04-11 DIAGNOSIS — M79672 Pain in left foot: Secondary | ICD-10-CM

## 2016-04-11 MED ORDER — MELOXICAM 15 MG PO TABS: ORAL_TABLET | ORAL | 3 refills | Status: DC

## 2016-04-11 NOTE — Assessment & Plan Note (Signed)
Pain is centered directly over the navicular suggestive of stress injury and this pleasant patient works all day on her feet. Meloxicam, x-rays, rehabilitation exercises, return for custom orthotics.

## 2016-04-11 NOTE — Progress Notes (Signed)
   Subjective:    I'm seeing this patient as a consultation for:  Dr. Beatrice Lecher  CC: Left foot pain  HPI: This is a pleasant 60 year old female, she works in [employer] on her feet all day, she has pain that she localizes along the medial foot, worse with weightbearing through the day. Moderate, persistent without radiation. No trauma.  Past medical history, Surgical history, Family history not pertinant except as noted below, Social history, Allergies, and medications have been entered into the medical record, reviewed, and no changes needed.   Review of Systems: No headache, visual changes, nausea, vomiting, diarrhea, constipation, dizziness, abdominal pain, skin rash, fevers, chills, night sweats, weight loss, swollen lymph nodes, body aches, joint swelling, muscle aches, chest pain, shortness of breath, mood changes, visual or auditory hallucinations.   Objective:   General: Well Developed, well nourished, and in no acute distress.  Neuro/Psych: Alert and oriented x3, extra-ocular muscles intact, able to move all 4 extremities, sensation grossly intact. Skin: Warm and dry, no rashes noted.  Respiratory: Not using accessory muscles, speaking in full sentences, trachea midline.  Cardiovascular: Pulses palpable, no extremity edema. Abdomen: Does not appear distended. Left Foot: No visible erythema or swelling. Range of motion is full in all directions. Strength is 5/5 in all directions. No hallux valgus. No pes cavus or pes planus. No abnormal callus noted. Pain over the navicular prominence No tenderness to palpation of the calcaneal insertion of plantar fascia. No pain at the Achilles insertion. No pain over the calcaneal bursa. No pain of the retrocalcaneal bursa. No tenderness to palpation over the tarsals, metatarsals, or phalanges. No hallux rigidus or limitus. No tenderness palpation over interphalangeal joints. No pain with compression of the metatarsal  heads. Neurovascularly intact distally.  Impression and Recommendations:   This case required medical decision making of moderate complexity.  Left foot pain Pain is centered directly over the navicular suggestive of stress injury and this pleasant patient works all day on her feet. Meloxicam, x-rays, rehabilitation exercises, return for custom orthotics.

## 2016-04-25 ENCOUNTER — Encounter: Payer: Managed Care, Other (non HMO) | Admitting: Sports Medicine

## 2016-05-02 ENCOUNTER — Ambulatory Visit (INDEPENDENT_AMBULATORY_CARE_PROVIDER_SITE_OTHER): Payer: Managed Care, Other (non HMO) | Admitting: Sports Medicine

## 2016-05-02 ENCOUNTER — Encounter: Payer: Self-pay | Admitting: Sports Medicine

## 2016-05-02 DIAGNOSIS — M79672 Pain in left foot: Secondary | ICD-10-CM

## 2016-05-02 DIAGNOSIS — IMO0002 Reserved for concepts with insufficient information to code with codable children: Secondary | ICD-10-CM | POA: Insufficient documentation

## 2016-05-02 DIAGNOSIS — M6530 Trigger finger, unspecified finger: Secondary | ICD-10-CM

## 2016-05-02 NOTE — Assessment & Plan Note (Signed)
Will return at her leisure for left and right third flexor tendon sheath injections.

## 2016-05-02 NOTE — Progress Notes (Signed)
    Patient was fitted for a : standard, cushioned, semi-rigid orthotic. The orthotic was heated and afterward the patient stood on the orthotic blank positioned on the orthotic stand. The patient was positioned in subtalar neutral position and 10 degrees of ankle dorsiflexion in a weight bearing stance. After completion of molding, a stable base was applied to the orthotic blank. The blank was ground to a stable position for weight bearing. Size: 7 Base: [employer] safety [employer] and [place]: Heel lift under the left orthotic, large leg length discrepancy right longer than left. The patient ambulated these, and they were very comfortable.  I spent 40 minutes with this patient, greater than 50% was face-to-face time counseling regarding the below diagnosis.

## 2016-05-02 NOTE — Assessment & Plan Note (Signed)
Custom orthotics as above. 

## 2016-05-30 ENCOUNTER — Ambulatory Visit: Payer: Managed Care, Other (non HMO) | Admitting: Sports Medicine

## 2016-06-02 ENCOUNTER — Encounter: Payer: Self-pay | Admitting: Sports Medicine

## 2016-06-02 ENCOUNTER — Ambulatory Visit (INDEPENDENT_AMBULATORY_CARE_PROVIDER_SITE_OTHER): Payer: Managed Care, Other (non HMO) | Admitting: Sports Medicine

## 2016-06-02 DIAGNOSIS — M79672 Pain in left foot: Secondary | ICD-10-CM

## 2016-06-02 NOTE — Assessment & Plan Note (Signed)
Pain is improved with custom orthotics, it is referable to the navicular and likely represents a stress injury. I have added a scaphoid pad to the orthotic, she can return to see me in an additional month.

## 2016-06-02 NOTE — Progress Notes (Signed)
  Subjective:    CC: L foot pain   HPI: 60 yo F with history of L navicular stress injury presenting for one month follow-up after having custom orthotics made and starting Meloxicam.  She says pain has nearly resolved, although she does continue to have some pain and swelling when she is on her feet at work.  Pain does not radiate. She says Meloxicam and orthotics have been helping with the pain.    Past medical history:  Negative.  See flowsheet/record as well for more information.  Surgical history: Negative.  See flowsheet/record as well for more information.  Family history: Negative.  See flowsheet/record as well for more information.  Social history: Negative.  See flowsheet/record as well for more information.  Allergies, and medications have been entered into the medical record, reviewed, and no changes needed.   Review of Systems: No fevers, chills, night sweats, weight loss, chest pain, or shortness of breath.   Objective:    General: Well Developed, well nourished, and in no acute distress.  Neuro: Alert and oriented x3, extra-ocular muscles intact, sensation grossly intact.  HEENT: Normocephalic, atraumatic, pupils equal round reactive to light, neck supple, no masses, no lymphadenopathy, thyroid nonpalpable.  Skin: Warm and dry, no rashes. Cardiac: Regular rate and rhythm, no murmurs rubs or gallops, no lower extremity edema.  Respiratory: Clear to auscultation bilaterally. Not using accessory muscles, speaking in full sentences. L Ankle: Mild swelling over navicular.  Range of motion is full in all directions. Strength is 5/5 in all directions. Stable lateral and medial ligaments; squeeze test and kleiger test unremarkable; Talar dome nontender; No pain at base of 5th MT; No tenderness over cuboid; TTP over navicular prominence.  No tenderness on posterior aspects of lateral and medial malleolus No sign of peroneal tendon subluxations or tenderness to palpation Negative  tarsal tunnel tinel's Able to walk 4 steps.     Impression and Recommendations:    1. L navicular stress injury: continues to improve with meloxicam and custom orthotics -adding scaphoid pad today -return in 1 month for follow-up

## 2016-06-11 ENCOUNTER — Ambulatory Visit: Payer: Managed Care, Other (non HMO)

## 2016-06-18 ENCOUNTER — Ambulatory Visit: Payer: Managed Care, Other (non HMO)

## 2016-06-28 ENCOUNTER — Other Ambulatory Visit: Payer: Self-pay | Admitting: Family Medicine

## 2016-06-30 ENCOUNTER — Ambulatory Visit (INDEPENDENT_AMBULATORY_CARE_PROVIDER_SITE_OTHER): Payer: Managed Care, Other (non HMO) | Admitting: Sports Medicine

## 2016-06-30 DIAGNOSIS — M79672 Pain in left foot: Secondary | ICD-10-CM | POA: Diagnosis not present

## 2016-06-30 DIAGNOSIS — Z23 Encounter for immunization: Secondary | ICD-10-CM

## 2016-06-30 NOTE — Progress Notes (Signed)
  Subjective:    CC: Follow-up  HPI: Foot pain: Resolved with the addition of a scaphoid pad to the custom orthotics  Past medical history:  Negative.  See flowsheet/record as well for more information.  Surgical history: Negative.  See flowsheet/record as well for more information.  Family history: Negative.  See flowsheet/record as well for more information.  Social history: Negative.  See flowsheet/record as well for more information.  Allergies, and medications have been entered into the medical record, reviewed, and no changes needed.   Review of Systems: No fevers, chills, night sweats, weight loss, chest pain, or shortness of breath.   Objective:    General: Well Developed, well nourished, and in no acute distress.  Neuro: Alert and oriented x3, extra-ocular muscles intact, sensation grossly intact.  HEENT: Normocephalic, atraumatic, pupils equal round reactive to light, neck supple, no masses, no lymphadenopathy, thyroid nonpalpable.  Skin: Warm and dry, no rashes. Cardiac: Regular rate and rhythm, no murmurs rubs or gallops, no lower extremity edema.  Respiratory: Clear to auscultation bilaterally. Not using accessory muscles, speaking in full sentences. Left Ankle: No visible erythema or swelling. Range of motion is full in all directions. Strength is 5/5 in all directions. Stable lateral and medial ligaments; squeeze test and kleiger test unremarkable; Talar dome nontender; No pain at base of 5th MT; No tenderness over cuboid; No tenderness over N spot or navicular prominence No tenderness on posterior aspects of lateral and medial malleolus No sign of peroneal tendon subluxations; Negative tarsal tunnel tinel's Left Foot: No visible erythema or swelling. Range of motion is full in all directions. Strength is 5/5 in all directions. No hallux valgus. No pes cavus or pes planus. No abnormal callus noted. No pain over the navicular prominence, or base of fifth  metatarsal. No tenderness to palpation of the calcaneal insertion of plantar fascia. No pain at the Achilles insertion. No pain over the calcaneal bursa. No pain of the retrocalcaneal bursa. No tenderness to palpation over the tarsals, metatarsals, or phalanges. No hallux rigidus or limitus. No tenderness palpation over interphalangeal joints. No pain with compression of the metatarsal heads. Neurovascularly intact distally.  Impression and Recommendations:    Left foot pain Pain improved with custom orthotics, it was referable to the navicular, and likely represented a stress injury, we added a scaphoid pad to the orthotic and she returns today nearly pain-free.

## 2016-06-30 NOTE — Assessment & Plan Note (Signed)
Pain improved with custom orthotics, it was referable to the navicular, and likely represented a stress injury, we added a scaphoid pad to the orthotic and she returns today nearly pain-free.

## 2016-07-02 ENCOUNTER — Other Ambulatory Visit: Payer: Self-pay

## 2016-07-02 MED ORDER — LOSARTAN POTASSIUM 50 MG PO TABS: 50.0000 mg | ORAL_TABLET | Freq: Every day | ORAL | 0 refills | Status: DC

## 2016-07-25 ENCOUNTER — Encounter: Payer: Managed Care, Other (non HMO) | Admitting: Family Medicine

## 2016-08-29 ENCOUNTER — Encounter: Payer: Managed Care, Other (non HMO) | Admitting: Family Medicine

## 2016-10-01 ENCOUNTER — Encounter: Payer: Self-pay | Admitting: Emergency Medicine

## 2016-10-01 ENCOUNTER — Emergency Department
Admission: EM | Admit: 2016-10-01 | Discharge: 2016-10-01 | Disposition: A | Discharge status: Home / Self Care | Payer: Managed Care, Other (non HMO) | Source: Home / Self Care | Attending: Family Medicine | Admitting: Family Medicine

## 2016-10-01 DIAGNOSIS — R69 Illness, unspecified: Secondary | ICD-10-CM

## 2016-10-01 DIAGNOSIS — J111 Influenza due to unidentified influenza virus with other respiratory manifestations: Secondary | ICD-10-CM

## 2016-10-01 MED ORDER — OSELTAMIVIR PHOSPHATE 75 MG PO CAPS: 75.0000 mg | ORAL_CAPSULE | Freq: Two times a day (BID) | ORAL | 0 refills | Status: DC

## 2016-10-01 MED ORDER — BENZONATATE 200 MG PO CAPS: ORAL_CAPSULE | ORAL | 0 refills | Status: DC

## 2016-10-01 MED ORDER — ONDANSETRON 4 MG PO TBDP: ORAL_TABLET | ORAL | 0 refills | Status: DC

## 2016-10-01 NOTE — ED Triage Notes (Signed)
Pt c/o cough, body aches, vomiting and sore throat x2 days. She has not taken any meds for this.

## 2016-10-01 NOTE — ED Provider Notes (Signed)
[name]    CSN: [id number] Arrival date & time: 10/01/16  1347     History   Chief Complaint Chief Complaint  Patient presents with  . Cough    HPI Shannon [name] is a 61 y.o. female.   Patient developed a mild cough two days ago, and yesterday developed myalgias, headache, increased cough, sore throat, fever, fatigue, and nausea.   The history is provided by the patient.    Past Medical History:  Diagnosis Date  . History of hysterectomy 1995    Patient Active Problem List   Diagnosis Date Noted  . Trigger finger of both hands 05/02/2016  . Left foot pain 04/11/2016  . IBS 12/13/2007  . HYPERLIPIDEMIA 07/26/2007  . HYPERTENSION, BENIGN ESSENTIAL 05/04/2007  . MITRAL VALVE PROLAPSE 05/04/2007  . MENOPAUSE, EARLY 05/04/2007    Past Surgical History:  Procedure Laterality Date  . ABDOMINAL HYSTERECTOMY  1995  . CHOLECYSTECTOMY      OB History    No data available       Home Medications    Prior to Admission medications   Medication Sig Start Date End Date Taking? Authorizing Provider  benzonatate (TESSALON) 200 MG capsule Take one cap by mouth at bedtime as needed for cough.  May repeat in 4 to 6 hours 10/01/16   Kandra Nicolas, MD  Calcium Carbonate-Vitamin D (CALCIUM-VITAMIN D) 500-200 MG-UNIT per tablet Take 1 tablet by mouth 2 (two) times daily with a meal.      Historical Provider, MD  fish oil-omega-3 fatty acids 1000 MG capsule Take 1 g by mouth. Take 2 tabs twice daily.     Hali Marry, MD  losartan (COZAAR) 50 MG tablet Take 1 tablet (50 mg total) by mouth daily. NEED FOLLOW UP APPOINTMENT FOR MORE REFILLS 07/02/16   Hali Marry, MD  meloxicam (MOBIC) 15 MG tablet One tab PO qAM with breakfast for 2 weeks, then daily prn pain. 04/11/16   Silverio Decamp, MD  Multiple Vitamin (MULTIVITAMIN) tablet Take 1 tablet by mouth daily.      Historical Provider, MD  oseltamivir (TAMIFLU) 75 MG capsule Take 1 capsule (75  mg total) by mouth every 12 (twelve) hours. 10/01/16   Kandra Nicolas, MD    Family History Family History  Problem Relation Age of Onset  . Drug abuse Brother   . Diabetes type II      aunt  . Hyperlipidemia Sister   . Cirrhosis Sister     non alcoholic fatty cirrhosis of the liver  . Cancer Father     prostate  . Colon cancer Sister     Social History Social History  Substance Use Topics  . Smoking status: Never Smoker  . Smokeless tobacco: Never Used  . Alcohol use Yes     Allergies   Penicillins   Review of Systems Review of Systems + sore throat + cough No pleuritic pain No wheezing + nasal congestion + post-nasal drainage No sinus pain/pressure No itchy/red eyes No earache No hemoptysis No SOB No fever, + chills + nausea No vomiting No abdominal pain No diarrhea No urinary symptoms No skin rash + fatigue + myalgias + headache Used OTC meds without relief   Physical Exam Triage Vital Signs ED Triage Vitals  Enc Vitals Group     BP 10/01/16 1424 126/82     Pulse Rate 10/01/16 1424 90     Resp --      Temp 10/01/16 1424  97.6 F (36.4 C)     Temp Source 10/01/16 1424 Oral     SpO2 10/01/16 1424 97 %     Weight 10/01/16 1425 213 lb (96.6 kg)     Height --      Head Circumference --      Peak Flow --      Pain Score 10/01/16 1426 0     Pain Loc --      Pain Edu? --      Excl. in [place]? --    No data found.   Updated Vital Signs BP 126/82 (BP Location: Right Arm)   Pulse 90   Temp 97.6 F (36.4 C) (Oral)   Wt 213 lb (96.6 kg)   SpO2 97%   BMI 36.56 kg/m   Visual Acuity Right Eye Distance:   Left Eye Distance:   Bilateral Distance:    Right Eye Near:   Left Eye Near:    Bilateral Near:     Physical Exam Nursing notes and Vital Signs reviewed. Appearance:  Patient appears stated age, and in no acute distress Eyes:  Pupils are equal, round, and reactive to light and accomodation.  Extraocular movement is intact.  Conjunctivae  are not inflamed  Ears:  Canals normal.  Tympanic membranes normal.  Nose:  Mildly congested turbinates.  No sinus tenderness.   Pharynx:  Normal Neck:  Supple.  Tender enlarged posterior/lateral nodes are palpated bilaterally  Lungs:  Clear to auscultation.  Breath sounds are equal.  Moving air well. Heart:  Regular rate and rhythm without murmurs, rubs, or gallops.  Abdomen:  Nontender without masses or hepatosplenomegaly.  Bowel sounds are present.  No CVA or flank tenderness.  Extremities:  No edema.  Skin:  No rash present.    UC Treatments / Results  Labs (all labs ordered are listed, but only abnormal results are displayed) Labs Reviewed - No data to display  EKG  EKG Interpretation None       Radiology No results found.  Procedures Procedures (including critical care time)  Medications Ordered in UC Medications - No data to display   Initial Impression / Assessment and Plan / UC Course  I have reviewed the triage vital signs and the nursing notes.  Pertinent labs & imaging results that were available during my care of the patient were reviewed by me and considered in my medical decision making (see chart for details).    Begin Tamiflu. Prescription written for Benzonatate [facility]) to take at bedtime for night-time cough.  Take plain guaifenesin (1200mg  extended release tabs such as Mucinex) twice daily, with plenty of water, for cough and congestion.  Get adequate rest.   May use Afrin nasal spray (or generic oxymetazoline) twice daily for about 5 days and then discontinue.  Also recommend using saline nasal spray several times daily and saline nasal irrigation (AYR is a common brand).  Use Flonase nasal spray each morning after using Afrin nasal spray and saline nasal irrigation. Try warm salt water gargles for sore throat.  Stop all antihistamines for now, and other non-prescription cough/cold preparations. May take Ibuprofen 200mg , 4 tabs every 8 hours with  food for body aches, headache, etc. Followup with Family Doctor if not improved in about 5 days.    Final Clinical Impressions(s) / UC Diagnoses   Final diagnoses:  Influenza-like illness    New Prescriptions New Prescriptions   BENZONATATE (TESSALON) 200 MG CAPSULE    Take one cap by mouth at bedtime  as needed for cough.  May repeat in 4 to 6 hours   OSELTAMIVIR (TAMIFLU) 75 MG CAPSULE    Take 1 capsule (75 mg total) by mouth every 12 (twelve) hours.     Kandra Nicolas, MD 10/01/16 [phone]

## 2016-10-01 NOTE — Discharge Instructions (Signed)
Take plain guaifenesin (1200mg  extended release tabs such as Mucinex) twice daily, with plenty of water, for cough and congestion.  Get adequate rest.   May use Afrin nasal spray (or generic oxymetazoline) twice daily for about 5 days and then discontinue.  Also recommend using saline nasal spray several times daily and saline nasal irrigation (AYR is a common brand).  Use Flonase nasal spray each morning after using Afrin nasal spray and saline nasal irrigation. Try warm salt water gargles for sore throat.  Stop all antihistamines for now, and other non-prescription cough/cold preparations. May take Ibuprofen 200mg , 4 tabs every 8 hours with food for body aches, headache, etc.

## 2016-10-07 ENCOUNTER — Ambulatory Visit: Payer: Managed Care, Other (non HMO) | Admitting: Family Medicine

## 2016-10-10 ENCOUNTER — Encounter: Payer: Self-pay | Admitting: Family Medicine

## 2016-10-10 ENCOUNTER — Ambulatory Visit (INDEPENDENT_AMBULATORY_CARE_PROVIDER_SITE_OTHER): Payer: Managed Care, Other (non HMO) | Admitting: Family Medicine

## 2016-10-10 VITALS — BP 166/80 | HR 81 | Ht 64.0 in | Wt 213.0 lb

## 2016-10-10 DIAGNOSIS — Z0000 Encounter for general adult medical examination without abnormal findings: Secondary | ICD-10-CM | POA: Diagnosis not present

## 2016-10-10 DIAGNOSIS — J111 Influenza due to unidentified influenza virus with other respiratory manifestations: Secondary | ICD-10-CM | POA: Diagnosis not present

## 2016-10-10 MED ORDER — LOSARTAN POTASSIUM 50 MG PO TABS: 50.0000 mg | ORAL_TABLET | Freq: Every day | ORAL | 1 refills | Status: DC

## 2016-10-10 NOTE — Progress Notes (Signed)
   Subjective:    Patient ID: Shannon [name], female    DOB: 03/14/1956, 61 y.o.   MRN: [id number]  HPI 61 year old female comes in today for follow-up from recent urgent care visit. She was seen approximately 9 days ago at urgent care here in the building. At the time she had a cough myalgias headache sore throat fever and fatigue and nausea. She was diagnosed with influenza-like illness and treated with Tamiflu and Tessalon Perles. She is here today for a recheck.  Hypertension- Pt denies chest pain, SOB, dizziness, or heart palpitations.  Taking meds as directed w/o problems.  Denies medication side effects.  She actually think she forgot take her blood pressure medication this morning.  Review of Systems     Objective:   Physical Exam  Constitutional: She is oriented to person, place, and time. She appears well-developed and well-nourished.  HENT:  Head: Normocephalic and atraumatic.  Cardiovascular: Normal rate, regular rhythm and normal heart sounds.   Pulmonary/Chest: Effort normal and breath sounds normal.  Neurological: She is alert and oriented to person, place, and time.  Skin: Skin is warm and dry.  Psychiatric: She has a normal mood and affect. Her behavior is normal.          Assessment & Plan:  Influenza-exam is completely clear today and she is feeling much better. Issue resolved. Next  Hypertension-uncontrolled but she thinks she may forgot her medication today. I like her come back in about 2 weeks for nurse blood pressure check just to make sure that we are at goal and that way if not then we can adjust her medication regimen.

## 2016-10-15 LAB — COMPLETE METABOLIC PANEL WITH GFR
ALT: 28 U/L (ref 6–29)
AST: 19 U/L (ref 10–35)
Albumin: 4 g/dL (ref 3.6–5.1)
Alkaline Phosphatase: 52 U/L (ref 33–130)
BILIRUBIN TOTAL: 1 mg/dL (ref 0.2–1.2)
BUN: 12 mg/dL (ref 7–25)
CALCIUM: 9 mg/dL (ref 8.6–10.4)
CHLORIDE: 105 mmol/L (ref 98–110)
CO2: 25 mmol/L (ref 20–31)
CREATININE: 0.71 mg/dL (ref 0.50–1.05)
GFR, Est African American: >89 mL/min (ref >=60)
GFR, Est Non African American: >89 mL/min (ref >=60)
Glucose, Bld: 92 mg/dL (ref 65–99)
Potassium: 4.2 mmol/L (ref 3.5–5.3)
Sodium: 141 mmol/L (ref 135–146)
TOTAL PROTEIN: 6.5 g/dL (ref 6.1–8.1)

## 2016-10-15 LAB — LIPID PANEL W/REFLEX DIRECT LDL
CHOLESTEROL: 207 mg/dL — AB (ref <200)
HDL: 43 mg/dL — ABNORMAL LOW (ref >50)
LDL-CHOLESTEROL: 120 mg/dL — AB
NON-HDL CHOLESTEROL (CALC): 164 mg/dL — AB (ref <130)
Total CHOL/HDL Ratio: 4.8 Ratio (ref <5.0)
Triglycerides: 288 mg/dL — ABNORMAL HIGH (ref <150)

## 2016-10-15 LAB — TSH: TSH: 1.53 mIU/L

## 2016-10-21 ENCOUNTER — Ambulatory Visit (INDEPENDENT_AMBULATORY_CARE_PROVIDER_SITE_OTHER): Payer: Managed Care, Other (non HMO)

## 2016-10-21 DIAGNOSIS — Z1231 Encounter for screening mammogram for malignant neoplasm of breast: Secondary | ICD-10-CM | POA: Diagnosis not present

## 2016-10-28 ENCOUNTER — Ambulatory Visit (INDEPENDENT_AMBULATORY_CARE_PROVIDER_SITE_OTHER): Payer: Managed Care, Other (non HMO) | Admitting: Family Medicine

## 2016-10-28 ENCOUNTER — Encounter: Payer: Self-pay | Admitting: Family Medicine

## 2016-10-28 VITALS — BP 160/90 | HR 78 | Ht 64.0 in | Wt 215.0 lb

## 2016-10-28 DIAGNOSIS — I10 Essential (primary) hypertension: Secondary | ICD-10-CM | POA: Diagnosis not present

## 2016-10-28 DIAGNOSIS — Z0000 Encounter for general adult medical examination without abnormal findings: Secondary | ICD-10-CM

## 2016-10-28 MED ORDER — LOSARTAN POTASSIUM-HCTZ 100-25 MG PO TABS: 1.0000 | ORAL_TABLET | Freq: Every day | ORAL | 3 refills | Status: DC

## 2016-10-28 NOTE — Patient Instructions (Signed)
Keep up a regular exercise program and make sure you are eating a healthy diet Try to eat 4 servings of dairy a day, or if you are lactose intolerant take a calcium with vitamin D daily.  Your vaccines are up to date.   

## 2016-10-28 NOTE — Progress Notes (Signed)
Subjective:     Shannon [name] is a 61 y.o. female and is here for a comprehensive physical exam. The patient reports no problems.She is not interested in colonoscopy right now. She is not exercising regularly.  Hypertension- Pt denies chest pain, SOB, dizziness, or heart palpitations.  Taking meds as directed w/o problems.  Denies medication side effects.    Health Maintenance  Topic Date Due  . HIV Screening  07/03/1973  . COLONOSCOPY  10/28/2017 (Originally 07/03/2008)  . MAMMOGRAM  10/21/2018  . TETANUS/TDAP  11/29/2025  . INFLUENZA VACCINE  Completed  . Hepatitis C Screening  Completed     Social History   Social History  . Marital status: Married    Spouse name: [name].   . Number of children: 0  . Years of education: N/A   Occupational History  . works in [place] Topics  . Smoking status: Never Smoker  . Smokeless tobacco: Never Used  . Alcohol use Yes  . Drug use: No  . Sexual activity: Not on file   Other Topics Concern  . 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 Married married to Bunk Foss. No children. 3 caffeinated drinks daily. No regular exercise.   Health Maintenance  Topic Date Due  . HIV Screening  07/03/1973  . COLONOSCOPY  07/03/2008  . MAMMOGRAM  10/21/2018  . TETANUS/TDAP  11/29/2025  . INFLUENZA VACCINE  Completed  . Hepatitis C Screening  Completed    The following portions of the patient's history were reviewed and updated as appropriate: allergies, current medications, past family history, past medical history, past social history, past surgical history and problem list.  Review of Systems A comprehensive review of systems was negative.   Objective:    BP (!) 180/84   Pulse 78   Ht 5\' 4"  (1.626 m)   Wt 215 lb (97.5 kg)   SpO2 97%   BMI 36.90 kg/m  General appearance: alert, cooperative and appears stated age Head: Normocephalic,  without obvious abnormality, atraumatic Eyes: conj clear, EOMI< pEEERLA Ears: normal TM's and external ear canals both ears Nose: Nares normal. Septum midline. Mucosa normal. No drainage or sinus tenderness. Throat: lips, mucosa, and tongue normal; teeth and gums normal Neck: no adenopathy, no carotid bruit, no JVD, supple, symmetrical, trachea midline and thyroid not enlarged, symmetric, no tenderness/mass/nodules Back: symmetric, no curvature. ROM normal. No CVA tenderness. Lungs: clear to auscultation bilaterally Heart: regular rate and rhythm, S1, S2 normal, no murmur, click, rub or gallop Abdomen: soft, non-tender; bowel sounds normal; no masses,  no organomegaly Extremities: extremities normal, atraumatic, no cyanosis or edema Pulses: 2+ and symmetric Skin: Skin color, texture, turgor normal. No rashes or lesions Lymph nodes: Cervical, supraclavicular, and axillary nodes normal. Neurologic: Alert and oriented X 3, normal strength and tone. Normal symmetric reflexes. Normal coordination and gait    Assessment:    Healthy female exam.      Plan:     See After Visit Summary for Counseling Recommendations   Keep up a regular exercise program and make sure you are eating a healthy diet Try to eat 4 servings of dairy a day, or if you are lactose intolerant take a calcium with vitamin D daily.  Your vaccines are up to date.   HTN - uncontrolled. Will add Natrecor thiazide back to the losartan. Blood pressure check in  2 weeks with repeat BMP.

## 2016-11-11 ENCOUNTER — Ambulatory Visit: Payer: Managed Care, Other (non HMO)

## 2016-11-14 ENCOUNTER — Ambulatory Visit (INDEPENDENT_AMBULATORY_CARE_PROVIDER_SITE_OTHER): Payer: Managed Care, Other (non HMO) | Admitting: Family Medicine

## 2016-11-14 VITALS — BP 124/78 | HR 87 | Ht 64.0 in | Wt 215.0 lb

## 2016-11-14 DIAGNOSIS — I10 Essential (primary) hypertension: Secondary | ICD-10-CM | POA: Diagnosis not present

## 2016-11-14 NOTE — Progress Notes (Signed)
Agree with above 

## 2016-11-14 NOTE — Progress Notes (Signed)
   Subjective:    Patient ID: Shannon [name], female    DOB: 1955-09-19, 61 y.o.   MRN: [id number]  HPI Pt is here for a BP and weight check. Pt denies chest pain, shortness of breath, palpitations, headaches, or any problems with medication.   Review of Systems     Objective:   Physical Exam        Assessment & Plan:  Pt stated she was going to have her BMP done on 11/17/16. Pt advised to schedule a follow-up with provider in 30 days.

## 2017-03-02 ENCOUNTER — Ambulatory Visit: Payer: Managed Care, Other (non HMO) | Admitting: Family Medicine

## 2017-04-02 ENCOUNTER — Ambulatory Visit (INDEPENDENT_AMBULATORY_CARE_PROVIDER_SITE_OTHER): Payer: Managed Care, Other (non HMO) | Admitting: Family Medicine

## 2017-04-02 ENCOUNTER — Encounter: Payer: Self-pay | Admitting: Family Medicine

## 2017-04-02 VITALS — BP 137/72 | HR 87 | Wt 212.0 lb

## 2017-04-02 DIAGNOSIS — Z800 Family history of malignant neoplasm of digestive organs: Secondary | ICD-10-CM

## 2017-04-02 DIAGNOSIS — I10 Essential (primary) hypertension: Secondary | ICD-10-CM | POA: Diagnosis not present

## 2017-04-02 DIAGNOSIS — K2100 Gastro-esophageal reflux disease with esophagitis, without bleeding: Secondary | ICD-10-CM

## 2017-04-02 NOTE — Progress Notes (Signed)
Subjective:    Patient ID: Shannon [name], female    DOB: 1955-08-27, 61 y.o.   MRN: [id number]  HPI Hypertension- Four-month follow-up. Pt denies chest pain, SOB, dizziness, or heart palpitations.  Taking meds as directed w/o problems.  Denies medication side effects.    She also complains that she is having some reflux symptoms on and off. Is going up into the mid part of her chest. She says when it happens particularly when she eats foods that she knows are not good for her shot to take 2 Nexium in the morning and then 2 times in the evenings to get it to go away.    Her sister also recently passed away from colon [name]. She herself has never had a screening colonoscopy or any type of colon [name] screening.   Review of Systems  BP 137/72   Pulse 87   Wt 212 lb (96.2 kg)   SpO2 99%   BMI 36.39 kg/m     Allergies  Allergen Reactions  . Adhesive [Tape]   . Penicillins     Past Medical History:  Diagnosis Date  . History of hysterectomy 1995    Past Surgical History:  Procedure Laterality Date  . ABDOMINAL HYSTERECTOMY  1995  . CHOLECYSTECTOMY      Social History   Social History  . Marital status: Married    Spouse name: Shannon [name].   . Number of children: 0  . Years of education: N/A   Occupational History  . works in [place] Topics  . Smoking status: Never Smoker  . Smokeless tobacco: Never Used  . Alcohol use Yes  . Drug use: No  . Sexual activity: Not on file   Other Topics Concern  . 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 No children. 3 caffeinated drinks daily. No regular exercise.    Family History  Problem Relation Age of Onset  . Drug abuse Brother   . Diabetes type II Unknown        aunt  . Hyperlipidemia Sister   . Cirrhosis Sister        non alcoholic fatty cirrhosis of the liver  . [name] Father    prostate  . Colon [name] Sister     Outpatient Encounter Prescriptions as of 04/02/2017  Medication Sig  . Calcium Carbonate-Vitamin D (CALCIUM-VITAMIN D) 500-200 MG-UNIT per tablet Take 1 tablet by mouth 2 (two) times daily with a meal.    . losartan-hydrochlorothiazide (HYZAAR) 100-25 MG tablet Take 1 tablet by mouth daily.  . Multiple Vitamin (MULTIVITAMIN) tablet Take 1 tablet by mouth daily.    . [DISCONTINUED] fish oil-omega-3 fatty acids 1000 MG capsule Take 1 g by mouth. Take 2 tabs twice daily.   . [DISCONTINUED] losartan (COZAAR) 50 MG tablet Take 1 tablet (50 mg total) by mouth daily.  . [DISCONTINUED] meloxicam (MOBIC) 15 MG tablet One tab PO qAM with breakfast for 2 weeks, then daily prn pain.   No facility-administered encounter medications on file as of 04/02/2017.          Objective:   Physical Exam  Constitutional: She is oriented to person, place, and time. She appears well-developed and well-nourished.  HENT:  Head: Normocephalic and atraumatic.  Neck: Neck supple. No thyromegaly present.  Cardiovascular: Normal rate, regular rhythm and normal heart sounds.  Pulmonary/Chest: Effort normal and breath sounds normal.  Lymphadenopathy:    She has no cervical adenopathy.  Neurological: She is alert and oriented to person, place, and time.  Skin: Skin is warm and dry.  Psychiatric: She has a normal mood and affect. Her behavior is normal.          Assessment & Plan:  HTN - Well controlled. Continue current regimen. Follow up in  6 months. Due for BMP now she is back on a diuretic.  GERD-discussed dietary changes. In addition if she needs acute relief and recommend Tums instead of taking 80 mg of Nexium per day. I explained that it's not quick acting and that's why she's having to take so much of it to get relief. She certainly could try a treatment.. She can take the Nexium once daily about 20-30 minutes before breakfast for about 6 weeks and then taper to every  other day and eventually every third day to do a treatment course and see if this helps control her symptoms.  First degree relative with colon [name]-sister recently passed away from this. Since she has never had colon [name] screening recommend referral for full colonoscopy.

## 2017-04-04 LAB — BASIC METABOLIC PANEL WITH GFR
BUN: 17 mg/dL (ref 7–25)
CO2: 25 mmol/L (ref 20–32)
Calcium: 9.2 mg/dL (ref 8.6–10.4)
Chloride: 103 mmol/L (ref 98–110)
Creat: 0.64 mg/dL (ref 0.50–1.05)
GFR, Est Non African American: >89 mL/min (ref >=60)
Glucose, Bld: 99 mg/dL (ref 65–99)
POTASSIUM: 3.7 mmol/L (ref 3.5–5.3)
Sodium: 139 mmol/L (ref 135–146)

## 2017-04-30 ENCOUNTER — Encounter: Payer: Self-pay | Admitting: Gastroenterology

## 2017-04-30 ENCOUNTER — Encounter: Payer: Self-pay | Admitting: Sports Medicine

## 2017-04-30 ENCOUNTER — Ambulatory Visit (INDEPENDENT_AMBULATORY_CARE_PROVIDER_SITE_OTHER): Payer: Managed Care, Other (non HMO) | Admitting: Sports Medicine

## 2017-04-30 DIAGNOSIS — IMO0002 Reserved for concepts with insufficient information to code with codable children: Secondary | ICD-10-CM

## 2017-04-30 DIAGNOSIS — M6530 Trigger finger, unspecified finger: Secondary | ICD-10-CM

## 2017-04-30 NOTE — Progress Notes (Signed)
  Subjective:    CC: Follow-up  HPI: Over a year ago I treated this 61 year old female for bilateral third trigger fingers with meloxicam and rehabilitation exercises. She did very well. Just recently is having a recurrence of triggering and pain, at this point she is agreeable to proceed with interventional treatment, pain is worsening, localized without radiation. Moderate mechanical symptoms.  Past medical history:  Negative.  See flowsheet/record as well for more information.  Surgical history: Negative.  See flowsheet/record as well for more information.  Family history: Negative.  See flowsheet/record as well for more information.  Social history: Negative.  See flowsheet/record as well for more information.  Allergies, and medications have been entered into the medical record, reviewed, and no changes needed.   Review of Systems: No fevers, chills, night sweats, weight loss, chest pain, or shortness of breath.   Objective:    General: Well Developed, well nourished, and in no acute distress.  Neuro: Alert and oriented x3, extra-ocular muscles intact, sensation grossly intact.  HEENT: Normocephalic, atraumatic, pupils equal round reactive to light, neck supple, no masses, no lymphadenopathy, thyroid nonpalpable.  Skin: Warm and dry, no rashes. Cardiac: Regular rate and rhythm, no murmurs rubs or gallops, no lower extremity edema.  Respiratory: Clear to auscultation bilaterally. Not using accessory muscles, speaking in full sentences. Hands: Palpable flexor tendon nodules in association with the third digits, visible and palpable triggering.  Procedure: Real-time Ultrasound Guided Injection of right third flexor tendon sheath Device: GE Logiq E  Verbal informed consent obtained.  Time-out conducted.  Noted no overlying erythema, induration, or other signs of local infection.  Skin prepped in a sterile fashion.  Local anesthesia: Topical Ethyl chloride.  With sterile technique and  under real time ultrasound guidance: Using a 25-gauge needle advanced just deep to the A1 pulley and injected 1/2 mL kenalog 40, 1/2 mL lidocaine.  Completed without difficulty  Pain immediately resolved suggesting accurate placement of the medication.  Advised to call if fevers/chills, erythema, induration, drainage, or persistent bleeding.  Images permanently stored and available for review in the ultrasound unit.  Impression: Technically successful ultrasound guided injection.   Procedure: Real-time Ultrasound Guided Injection of left third flexor tendon sheath Device: GE Logiq E  Verbal informed consent obtained.  Time-out conducted.  Noted no overlying erythema, induration, or other signs of local infection.  Skin prepped in a sterile fashion.  Local anesthesia: Topical Ethyl chloride.  With sterile technique and under real time ultrasound guidance: Using a 25-gauge needle advanced just deep to the A1 pulley and injected 1/2 mL kenalog 40, 1/2 mL lidocaine.  Completed without difficulty  Pain immediately resolved suggesting accurate placement of the medication.  Advised to call if fevers/chills, erythema, induration, drainage, or persistent bleeding.  Images permanently stored and available for review in the ultrasound unit.  Impression: Technically successful ultrasound guided injection.  Impression and Recommendations:    Trigger finger of both hands Did well for over a year with conservative treatment with NSAIDs and rehabilitation for right and left third flexor tendon sheath nodules and trigger fingers. Having persistence and recurrence of pain, desires interventional treatment today. Return in one month to evaluate response.  ___________________________________________ Gwen Her. Dianah Field, M.D., ABFM., CAQSM. Primary Care and [place] Instructor of [place] [facility]

## 2017-04-30 NOTE — Assessment & Plan Note (Signed)
Did well for over a year with conservative treatment with NSAIDs and rehabilitation for right and left third flexor tendon sheath nodules and trigger fingers. Having persistence and recurrence of pain, desires interventional treatment today. Return in one month to evaluate response.

## 2017-05-07 ENCOUNTER — Encounter: Payer: Self-pay | Admitting: Family Medicine

## 2017-05-07 ENCOUNTER — Ambulatory Visit (INDEPENDENT_AMBULATORY_CARE_PROVIDER_SITE_OTHER): Payer: Managed Care, Other (non HMO) | Admitting: Family Medicine

## 2017-05-07 VITALS — BP 132/80 | HR 78 | Temp 98.4°F

## 2017-05-07 DIAGNOSIS — R0789 Other chest pain: Secondary | ICD-10-CM

## 2017-05-07 DIAGNOSIS — Z23 Encounter for immunization: Secondary | ICD-10-CM | POA: Diagnosis not present

## 2017-05-07 DIAGNOSIS — I341 Nonrheumatic mitral (valve) prolapse: Secondary | ICD-10-CM | POA: Diagnosis not present

## 2017-05-07 DIAGNOSIS — I10 Essential (primary) hypertension: Secondary | ICD-10-CM | POA: Diagnosis not present

## 2017-05-07 NOTE — Patient Instructions (Signed)
Lets restart the nexium

## 2017-05-07 NOTE — Progress Notes (Signed)
Subjective:    Patient ID: Shannon [name], female    DOB: December 10, 1955, 61 y.o.   MRN: [id number]  HPI  61 year old female with a history of hypertension and hyperlipidemia comes in today complaining of some mild mid left lower chest pain. She took the Nexium for reflux which I had recently seen her for an says that overall but did help her symptoms but she still having a little bit of persistent intermittent pain that comes and goes. Though it doesn't seem to be food related. She does notice it's worse if she's been doing a lot of repetitive movements with her left arm. It isn't always necessarily triggered with activity. She does have a history of mitral valve prolapse. She denies any radiation of the pain, shortness of breath or diaphoresis with it.  Review of Systems   BP 132/80   Pulse 78   Temp 98.4 F (36.9 C) (Oral)   SpO2 98%     Allergies  Allergen Reactions  . Adhesive [Tape]   . Penicillins     Past Medical History:  Diagnosis Date  . History of hysterectomy 1995    Past Surgical History:  Procedure Laterality Date  . ABDOMINAL HYSTERECTOMY  1995  . CHOLECYSTECTOMY      Social History   Social History  . Marital status: Married    Spouse name: [name].   . Number of children: 0  . Years of education: N/A   Occupational History  . works in [place] Topics  . Smoking status: Never Smoker  . Smokeless tobacco: Never Used  . Alcohol use Yes  . Drug use: No  . Sexual activity: Not on file   Other Topics Concern  . 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 No children. 3 caffeinated drinks daily. No regular exercise.    Family History  Problem Relation Age of Onset  . Drug abuse Brother   . Diabetes type II Unknown        aunt  . Hyperlipidemia Sister   . Cirrhosis Sister        non alcoholic fatty cirrhosis of the liver   . Cancer Father        prostate  . Colon cancer Sister     Outpatient Encounter Prescriptions as of 05/07/2017  Medication Sig  . Calcium Carbonate-Vitamin D (CALCIUM-VITAMIN D) 500-200 MG-UNIT per tablet Take 1 tablet by mouth 2 (two) times daily with a meal.    . losartan-hydrochlorothiazide (HYZAAR) 100-25 MG tablet Take 1 tablet by mouth daily.  . Multiple Vitamin (MULTIVITAMIN) tablet Take 1 tablet by mouth daily.     No facility-administered encounter medications on file as of 05/07/2017.           Objective:   Physical Exam  Constitutional: She is oriented to person, place, and time. She appears well-developed and well-nourished.  HENT:  Head: Normocephalic and atraumatic.  Cardiovascular: Normal rate, regular rhythm and normal heart sounds.   No carotid bruit   Pulmonary/Chest: Effort normal and breath sounds normal.  Musculoskeletal: She exhibits no edema.  Neurological: She is alert and oriented to person, place, and time.  Skin: Skin is warm and dry.  Psychiatric: She has a normal mood and affect. Her behavior is normal.        Assessment & Plan:    Atypical  chest pain -  Reassured patient that I think is unlikely that this is cardiac pain. I think is either chest wall pain or related to her reflux. Okay to restart the Nexium. EKG today shows rate of 71 bpm, NSR no acute changes.  She does have a history of mitral valve prolapse in the last time she had an echocardiogram was about 20 years ago.   MVP - will schedule echo.

## 2017-05-28 ENCOUNTER — Encounter: Payer: Self-pay | Admitting: Sports Medicine

## 2017-05-28 ENCOUNTER — Ambulatory Visit (INDEPENDENT_AMBULATORY_CARE_PROVIDER_SITE_OTHER): Payer: Managed Care, Other (non HMO) | Admitting: Sports Medicine

## 2017-05-28 DIAGNOSIS — IMO0002 Reserved for concepts with insufficient information to code with codable children: Secondary | ICD-10-CM

## 2017-05-28 DIAGNOSIS — M6530 Trigger finger, unspecified finger: Secondary | ICD-10-CM | POA: Diagnosis not present

## 2017-05-28 LAB — CBC WITH DIFFERENTIAL/PLATELET
BASOS PCT: 0.4 %
Basophils Absolute: 40 cells/uL (ref 0–200)
EOS PCT: 0.4 %
Eosinophils Absolute: 40 cells/uL (ref 15–500)
HEMATOCRIT: 40.3 % (ref 35.0–45.0)
Hemoglobin: 14.1 g/dL (ref 11.7–15.5)
LYMPHS ABS: 3792 {cells}/uL (ref 850–3900)
MCH: 32.3 pg (ref 27.0–33.0)
MCHC: 35 g/dL (ref 32.0–36.0)
MCV: 92.4 fL (ref 80.0–100.0)
MPV: 9.7 fL (ref 7.5–12.5)
Monocytes Relative: 10.3 %
NEUTROS PCT: 50.6 %
Neutro Abs: 5009 cells/uL (ref 1500–7800)
Platelets: 345 10*3/uL (ref 140–400)
RBC: 4.36 10*6/uL (ref 3.80–5.10)
RDW: 13 % (ref 11.0–15.0)
Total Lymphocyte: 38.3 %
WBC: 9.9 10*3/uL (ref 3.8–10.8)
WBCMIX: 1020 {cells}/uL — AB (ref 200–950)

## 2017-05-28 NOTE — Assessment & Plan Note (Signed)
Right third finger did well after injection.  Repeat trigger finger injection on the left third flexor tendon sheath, I also peppered the A1 pulley. Return in one month, referral for trigger finger release if no better.

## 2017-05-28 NOTE — Progress Notes (Signed)
  Subjective:    CC: Follow-up  HPI: Trigger finger: Bilateral third flexor tendon sheath injections at the last visit, good response on the right, still has some triggering on the left.  Past medical history:  Negative.  See flowsheet/record as well for more information.  Surgical history: Negative.  See flowsheet/record as well for more information.  Family history: Negative.  See flowsheet/record as well for more information.  Social history: Negative.  See flowsheet/record as well for more information.  Allergies, and medications have been entered into the medical record, reviewed, and no changes needed.   Review of Systems: No fevers, chills, night sweats, weight loss, chest pain, or shortness of breath.   Objective:    General: Well Developed, well nourished, and in no acute distress.  Neuro: Alert and oriented x3, extra-ocular muscles intact, sensation grossly intact.  HEENT: Normocephalic, atraumatic, pupils equal round reactive to light, neck supple, no masses, no lymphadenopathy, thyroid nonpalpable.  Skin: Warm and dry, no rashes. Cardiac: Regular rate and rhythm, no murmurs rubs or gallops, no lower extremity edema.  Respiratory: Clear to auscultation bilaterally. Not using accessory muscles, speaking in full sentences. Left hand: Palpable flexor tendon nodule. Nontender.  Procedure: Real-time Ultrasound Guided Injection of left third flexor tendon sheath Device: GE Logiq E  Verbal informed consent obtained.  Time-out conducted.  Noted no overlying erythema, induration, or other signs of local infection.  Skin prepped in a sterile fashion.  Local anesthesia: Topical Ethyl chloride.  With sterile technique and under real time ultrasound guidance:  Using a 25-gauge needle advanced between the flexor digitorum superficialis and profundus tendons, I injected some medication, I then redirected the needle into the A1 pulley, and injected more medication for a total of 1/2 mL  kenalog 40, 1/2 mL lidocaine. Completed without difficulty  Pain immediately resolved suggesting accurate placement of the medication.  Advised to call if fevers/chills, erythema, induration, drainage, or persistent bleeding.  Images permanently stored and available for review in the ultrasound unit.  Impression: Technically successful ultrasound guided injection.  Impression and Recommendations:    Trigger finger of both hands Right third finger did well after injection.  Repeat trigger finger injection on the left third flexor tendon sheath, I also peppered the A1 pulley. Return in one month, referral for trigger finger release if no better. ___________________________________________ Gwen Her. Dianah Field, M.D., ABFM., CAQSM. Primary Care and [place] Instructor of [place] of [facility]

## 2017-06-15 ENCOUNTER — Ambulatory Visit (AMBULATORY_SURGERY_CENTER): Payer: Self-pay | Admitting: *Deleted

## 2017-06-15 VITALS — Ht 63.5 in | Wt 214.0 lb

## 2017-06-15 DIAGNOSIS — Z800 Family history of malignant neoplasm of digestive organs: Secondary | ICD-10-CM

## 2017-06-15 MED ORDER — NA SULFATE-K SULFATE-MG SULF 17.5-3.13-1.6 GM/177ML PO SOLN: 1.0000 | Freq: Once | ORAL | 0 refills | Status: AC

## 2017-06-15 NOTE — Progress Notes (Signed)
No egg or soy allergy known to patient  No issues with past sedation with any surgeries  or procedures, no intubation problems  No diet pills per patient No home 02 use per patient  No blood thinners per patient  Pt denies issues with constipation  No A fib or A flutter  EMMI video sent to pt's e mail  05-07-2017 OV with PCP states atypical chest pain, likely GERD related- pt states in pv no pain since started pepcid [facility]- due to MVP Dr Madilyn Fireman states will order echo- pt states never ordered and no further issues  $15 coupon to pt for suprep in [place] today

## 2017-06-16 ENCOUNTER — Encounter: Payer: Self-pay | Admitting: Gastroenterology

## 2017-06-25 ENCOUNTER — Ambulatory Visit: Payer: Managed Care, Other (non HMO) | Admitting: Sports Medicine

## 2017-06-29 ENCOUNTER — Encounter: Payer: Self-pay | Admitting: Gastroenterology

## 2017-06-29 ENCOUNTER — Ambulatory Visit (AMBULATORY_SURGERY_CENTER): Payer: Managed Care, Other (non HMO) | Admitting: Gastroenterology

## 2017-06-29 VITALS — BP 141/84 | HR 65 | Temp 96.9°F | Resp 14 | Ht 63.0 in | Wt 214.0 lb

## 2017-06-29 DIAGNOSIS — Z1211 Encounter for screening for malignant neoplasm of colon: Secondary | ICD-10-CM | POA: Diagnosis not present

## 2017-06-29 DIAGNOSIS — Z800 Family history of malignant neoplasm of digestive organs: Secondary | ICD-10-CM | POA: Diagnosis not present

## 2017-06-29 DIAGNOSIS — Z1212 Encounter for screening for malignant neoplasm of rectum: Secondary | ICD-10-CM

## 2017-06-29 DIAGNOSIS — K635 Polyp of colon: Secondary | ICD-10-CM

## 2017-06-29 DIAGNOSIS — D127 Benign neoplasm of rectosigmoid junction: Secondary | ICD-10-CM

## 2017-06-29 MED ORDER — SODIUM CHLORIDE 0.9 % IV SOLN: 500.0000 mL | INTRAVENOUS | Status: DC

## 2017-06-29 NOTE — Patient Instructions (Signed)
Impression/Recommendations:  Polyp handout given to patient. Diverticulosis handout given to patient. Hemorrhoid handout given to patient.  Resume previous diet. Continue present medications.  Await pathology results.  Repeat colonoscopy is recommended for surveillance.  Date to be determined after pathology results reviewed.  No Ibuprofen, naproxen, or other NSAID drugs for 2 weeks.  Tylenol only until Nov. 19, 2018.  YOU HAD AN ENDOSCOPIC PROCEDURE TODAY AT [facility]:   Refer to the procedure report that was given to you for any specific questions about what was found during the examination.  If the procedure report does not answer your questions, please call your gastroenterologist to clarify.  If you requested that your care partner not be given the details of your procedure findings, then the procedure report has been included in a sealed envelope for you to review at your convenience later.  YOU SHOULD EXPECT: Some feelings of bloating in the abdomen. Passage of more gas than usual.  Walking can help get rid of the air that was put into your GI tract during the procedure and reduce the bloating. If you had a lower endoscopy (such as a colonoscopy or flexible sigmoidoscopy) you may notice spotting of blood in your stool or on the toilet paper. If you underwent a bowel prep for your procedure, you may not have a normal bowel movement for a few days.  Please Note:  You might notice some irritation and congestion in your nose or some drainage.  This is from the oxygen used during your procedure.  There is no need for concern and it should clear up in a day or so.  SYMPTOMS TO REPORT IMMEDIATELY:   Following lower endoscopy (colonoscopy or flexible sigmoidoscopy):  Excessive amounts of blood in the stool  Significant tenderness or worsening of abdominal pains  Swelling of the abdomen that is new, acute  Fever of 100F or higher  For urgent or emergent issues, a  gastroenterologist can be reached at any hour by calling [phone].   DIET:  We do recommend a small meal at first, but then you may proceed to your regular diet.  Drink plenty of fluids but you should avoid alcoholic beverages for 24 hours.  ACTIVITY:  You should plan to take it easy for the rest of today and you should NOT DRIVE or use heavy machinery until tomorrow (because of the sedation medicines used during the test).    FOLLOW UP: Our staff will call the number listed on your records the next business day following your procedure to check on you and address any questions or concerns that you may have regarding the information given to you following your procedure. If we do not reach you, we will leave a message.  However, if you are feeling well and you are not experiencing any problems, there is no need to return our call.  We will assume that you have returned to your regular daily activities without incident.  If any biopsies were taken you will be contacted by phone or by letter within the next 1-3 weeks.  Please call us at [phone] if you have not heard about the biopsies in 3 weeks.    SIGNATURES/CONFIDENTIALITY: You and/or your care partner have signed paperwork which will be entered into your electronic medical record.  These signatures attest to the fact that that the information above on your After Visit Summary has been reviewed and is understood.  Full responsibility of the confidentiality of this discharge information  lies with you and/or your care-partner.

## 2017-06-29 NOTE — Progress Notes (Signed)
Called to room to assist during endoscopic procedure.  Patient ID and intended procedure confirmed with present staff. Received instructions for my participation in the procedure from the performing physician.  

## 2017-06-29 NOTE — Progress Notes (Signed)
A/ox3, pleased with MAC, report to RN 

## 2017-06-29 NOTE — Op Note (Signed)
[place] Patient Name: Shannon [name] Procedure Date: 06/29/2017 8:57 AM MRN: [id number] Endoscopist: Remo Lipps P. Deonta Bomberger MD, MD Age: 61 Referring MD:  Date of Birth: [birth year] Gender: Female Account #: [id number] Procedure:                Colonoscopy Indications:              Screening in patient at increased risk: Family                            history of 1st-degree relative with colorectal                            cancer (sister diagnosed around age 53), This is                            the patient's first colonoscopy Medicines:                Monitored Anesthesia Care Procedure:                Pre-Anesthesia Assessment:                           - Prior to the procedure, a History and Physical                            was performed, and patient medications and                            allergies were reviewed. The patient's tolerance of                            previous anesthesia was also reviewed. The risks                            and benefits of the procedure and the sedation                            options and risks were discussed with the patient.                            All questions were answered, and informed consent                            was obtained. Prior Anticoagulants: The patient has                            taken no previous anticoagulant or antiplatelet                            agents. ASA Grade Assessment: II - A patient with                            mild systemic disease. After reviewing the risks  and benefits, the patient was deemed in                            satisfactory condition to undergo the procedure.                           After obtaining informed consent, the colonoscope                            was passed under direct vision. Throughout the                            procedure, the patient's blood pressure, pulse, and                            oxygen saturations were  monitored continuously. The                            Colonoscope was introduced through the anus and                            advanced to the the cecum, identified by                            appendiceal orifice and ileocecal valve. The                            colonoscopy was performed without difficulty. The                            patient tolerated the procedure well. The quality                            of the bowel preparation was good. The ileocecal                            valve, appendiceal orifice, and rectum were                            photographed. Scope In: 9:02:30 AM Scope Out: 9:16:57 AM Scope Withdrawal Time: 0 hours 12 minutes 32 seconds  Total Procedure Duration: 0 hours 14 minutes 27 seconds  Findings:                 The perianal and digital rectal examinations were                            normal.                           A 3 mm polyp was found in the recto-sigmoid colon.                            The polyp was sessile. The polyp was removed with a  cold snare. Resection and retrieval were complete.                           A few small-mouthed diverticula were found in the                            sigmoid colon.                           Internal hemorrhoids were found during retroflexion.                           The exam was otherwise without abnormality. Complications:            No immediate complications. Estimated blood loss:                            Minimal. Estimated Blood Loss:     Estimated blood loss was minimal. Impression:               - One 3 mm polyp at the recto-sigmoid colon,                            removed with a cold snare. Resected and retrieved.                           - Diverticulosis in the sigmoid colon.                           - Internal hemorrhoids.                           - The examination was otherwise normal. Recommendation:           - Patient has a contact number available  for                            emergencies. The signs and symptoms of potential                            delayed complications were discussed with the                            patient. Return to normal activities tomorrow.                            Written discharge instructions were provided to the                            patient.                           - Resume previous diet.                           - Continue present medications.                           -  Await pathology results.                           - Repeat colonoscopy is recommended for                            surveillance. The colonoscopy date will be                            determined after pathology results from today's                            exam become available for review.                           - No ibuprofen, naproxen, or other non-steroidal                            anti-inflammatory drugs for 2 weeks after polyp                            removal. Remo Lipps P. Lynae Pederson MD, MD 06/29/2017 9:20:35 AM This report has been signed electronically.

## 2017-06-30 ENCOUNTER — Telehealth: Payer: Self-pay

## 2017-06-30 NOTE — Telephone Encounter (Signed)
  Follow up Call-  Call back number 06/29/2017  Post procedure Call Back phone  # [phone]  Permission to leave phone message Yes  Some recent data might be hidden     Patient questions:  Do you have a fever, pain , or abdominal swelling? No. Pain Score  0 *  Have you tolerated food without any problems? Yes.    Have you been able to return to your normal activities? Yes.    Do you have any questions about your discharge instructions: Diet   No. Medications  No. Follow up visit  No.  Do you have questions or concerns about your Care? No.  Actions: * If pain score is 4 or above: No action needed, pain <4.

## 2017-07-03 ENCOUNTER — Encounter: Payer: Self-pay | Admitting: Gastroenterology

## 2017-10-02 ENCOUNTER — Encounter: Payer: Self-pay | Admitting: Family Medicine

## 2017-10-02 ENCOUNTER — Ambulatory Visit (INDEPENDENT_AMBULATORY_CARE_PROVIDER_SITE_OTHER): Payer: Managed Care, Other (non HMO) | Admitting: Family Medicine

## 2017-10-02 VITALS — BP 138/75 | HR 88 | Ht 63.0 in | Wt 214.0 lb

## 2017-10-02 DIAGNOSIS — I10 Essential (primary) hypertension: Secondary | ICD-10-CM

## 2017-10-02 DIAGNOSIS — R1013 Epigastric pain: Secondary | ICD-10-CM | POA: Diagnosis not present

## 2017-10-02 DIAGNOSIS — I341 Nonrheumatic mitral (valve) prolapse: Secondary | ICD-10-CM

## 2017-10-02 NOTE — Progress Notes (Signed)
Subjective:    Patient ID: Shannon [name], female    DOB: [birth year], 62 y.o.   MRN: [id number]  HPI Hypertension- Pt denies chest pain, SOB, dizziness, or heart palpitations.  Taking meds as directed w/o problems.  Denies medication side effects.     MVP - she says she was never scheduled for her echo.   She still complaining of some intermittent epigastric/lower chest pain.  It comes and goes.  Is not related to eating or not eating.  It just seems to occur randomly.  But has persisted.  It is not as bothersome as it was previously.  She does notice significant improvement after she takes her Nexium for couple of days.  She wonders if it could be a liver issue since that does run in her family or even a pancreas issue.  Review of Systems  BP 138/75   Pulse 88   Ht 5\' 3"  (1.6 m)   Wt 214 lb (97.1 kg)   SpO2 99%   BMI 37.91 kg/m     Allergies  Allergen Reactions  . Adhesive [Tape]   . Penicillins     Past Medical History:  Diagnosis Date  . Arthritis    left knee   . GERD (gastroesophageal reflux disease)   . Heart murmur   . History of hysterectomy 1995  . Hyperlipidemia   . Hypertension   . MVP (mitral valve prolapse)     Past Surgical History:  Procedure Laterality Date  . ABDOMINAL HYSTERECTOMY  1995  . CHOLECYSTECTOMY      Social History   Socioeconomic History  . Marital status: Married    Spouse name: [name].   . Number of children: 0  . Years of education: Not on file  . Highest education level: Not on file  Social Needs  . Financial resource strain: Not on file  . Food insecurity - worry: Not on file  . Food insecurity - inability: Not on file  . Transportation needs - medical: Not on file  . Transportation needs - non-medical: Not on file  Occupational History  . 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 Smoking status: Never Smoker  . Smokeless tobacco: Never Used  Substance and Sexual Activity  . Alcohol  use: Yes    Comment: socially  . Drug use: No  . Sexual activity: Not on file  Other Topics Concern  . 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 Married married to [place]. No children. 3 caffeinated drinks daily. No regular exercise.    Family History  Problem Relation Age of Onset  . Drug abuse Brother   . Diabetes type II Unknown        aunt  . Hyperlipidemia Sister   . Cirrhosis Sister        non alcoholic fatty cirrhosis of the liver  . Cancer Father        prostate  . Prostate cancer Father   . Colon cancer Sister   . Esophageal cancer Neg Hx   . Stomach cancer Neg Hx   . Ulcerative colitis Neg Hx   . Colon polyps Neg Hx     Outpatient Encounter Medications as of 10/02/2017  Medication Sig  . cholecalciferol (VITAMIN D) 1000 units tablet Take 1,000 Units by mouth daily.  . famotidine (PEPCID AC) 10 MG chewable tablet Chew 10 mg by mouth 2 (  two) times daily.  Marland Kitchen losartan-hydrochlorothiazide (HYZAAR) 100-25 MG tablet Take 1 tablet by mouth daily.  . Multiple Vitamin (MULTIVITAMIN) tablet Take 1 tablet by mouth daily.     No facility-administered encounter medications on file as of 10/02/2017.          Objective:   Physical Exam  Constitutional: She is oriented to person, place, and time. She appears well-developed and well-nourished.  HENT:  Head: Normocephalic and atraumatic.  Cardiovascular: Normal rate, regular rhythm and normal heart sounds.  Pulmonary/Chest: Effort normal and breath sounds normal.  Neurological: She is alert and oriented to person, place, and time.  Skin: Skin is warm and dry.  Psychiatric: She has a normal mood and affect. Her behavior is normal.        Assessment & Plan:  HTN - Well controlled. Continue current regimen. Follow up in  6 months.    MVP -we will try to get her echocardiogram scheduled.  Epigastric pain-may be gastritis or GERD.  She is worried about her liver so we  will check liver enzymes as well as pancreatic enzymes as well.  It does not seem to be related to foods or really do not think it is her gallbladder.  Could consider testing for H. pylori if symptoms do not improve but she does get pretty quick resolution after taking a PPI.

## 2017-10-03 LAB — COMPLETE METABOLIC PANEL WITH GFR
AG RATIO: 1.7 (calc) (ref 1.0–2.5)
ALT: 28 U/L (ref 6–29)
AST: 23 U/L (ref 10–35)
Albumin: 4.6 g/dL (ref 3.6–5.1)
Alkaline phosphatase (APISO): 59 U/L (ref 33–130)
BILIRUBIN TOTAL: 0.8 mg/dL (ref 0.2–1.2)
BUN: 17 mg/dL (ref 7–25)
CHLORIDE: 102 mmol/L (ref 98–110)
CO2: 24 mmol/L (ref 20–32)
Calcium: 9.5 mg/dL (ref 8.6–10.4)
Creat: 0.87 mg/dL (ref 0.50–1.05)
GFR, EST AFRICAN AMERICAN: 85 mL/min/{1.73_m2} (ref >=60)
GFR, Est Non African American: 73 mL/min/{1.73_m2} (ref >=60)
GLUCOSE: 99 mg/dL (ref 65–99)
Globulin: 2.7 g/dL (calc) (ref 1.9–3.7)
POTASSIUM: 3.9 mmol/L (ref 3.5–5.3)
Sodium: 139 mmol/L (ref 135–146)
TOTAL PROTEIN: 7.3 g/dL (ref 6.1–8.1)

## 2017-10-03 LAB — AMYLASE: AMYLASE: 56 U/L (ref 21–101)

## 2017-10-03 LAB — LIPASE: Lipase: 30 U/L (ref 7–60)

## 2017-10-12 ENCOUNTER — Ambulatory Visit (HOSPITAL_BASED_OUTPATIENT_CLINIC_OR_DEPARTMENT_OTHER): Payer: Managed Care, Other (non HMO)

## 2017-10-30 ENCOUNTER — Other Ambulatory Visit: Payer: Self-pay | Admitting: Family Medicine

## 2017-10-30 DIAGNOSIS — I10 Essential (primary) hypertension: Secondary | ICD-10-CM

## 2017-11-12 ENCOUNTER — Ambulatory Visit (HOSPITAL_BASED_OUTPATIENT_CLINIC_OR_DEPARTMENT_OTHER): Payer: Managed Care, Other (non HMO)

## 2017-12-08 ENCOUNTER — Ambulatory Visit (INDEPENDENT_AMBULATORY_CARE_PROVIDER_SITE_OTHER): Payer: Managed Care, Other (non HMO)

## 2017-12-08 ENCOUNTER — Encounter: Payer: Self-pay | Admitting: Sports Medicine

## 2017-12-08 ENCOUNTER — Ambulatory Visit: Payer: Managed Care, Other (non HMO) | Admitting: Sports Medicine

## 2017-12-08 DIAGNOSIS — G8929 Other chronic pain: Secondary | ICD-10-CM | POA: Diagnosis not present

## 2017-12-08 DIAGNOSIS — M25551 Pain in right hip: Secondary | ICD-10-CM

## 2017-12-08 DIAGNOSIS — M1712 Unilateral primary osteoarthritis, left knee: Secondary | ICD-10-CM | POA: Insufficient documentation

## 2017-12-08 DIAGNOSIS — M810 Age-related osteoporosis without current pathological fracture: Secondary | ICD-10-CM | POA: Diagnosis not present

## 2017-12-08 MED ORDER — MELOXICAM 15 MG PO TABS: ORAL_TABLET | ORAL | 3 refills | Status: DC

## 2017-12-08 NOTE — Assessment & Plan Note (Addendum)
Piriformis and versus trochanteric bursitis, no sciatica. X-rays, meloxicam, rehab exercises, return in 1 month, MRI if no better.  Some SI joint degenerative changes, this can be an interventional target too if still painful at the followup visit.

## 2017-12-08 NOTE — Progress Notes (Addendum)
Subjective:    I'm seeing this patient as a consultation for: Dr. Beatrice Lecher  CC: Right hip pain, left knee pain  HPI: For months now this pleasant 62 year old female has had pain that she localizes along the medial aspect of [name] left knee, minimal mechanical symptoms, significant gelling, moderate, persistent without radiation, oral NSAIDs and analgesics are insufficiently efficacious.  In addition she has had pain in [name] right hip, lateral and a bit posterior in the buttock, moderate, persistent without radiation, worse when laying on the ipsilateral side at night.  I reviewed the past medical history, family history, social history, surgical history, and allergies today and no changes were needed.  Please see the problem list section below in epic for further details.  Past Medical History: Past Medical History:  Diagnosis Date  . Arthritis    left knee   . GERD (gastroesophageal reflux disease)   . Heart murmur   . History of hysterectomy 1995  . Hyperlipidemia   . Hypertension   . MVP (mitral valve prolapse)    Past Surgical History: Past Surgical History:  Procedure Laterality Date  . ABDOMINAL HYSTERECTOMY  1995  . CHOLECYSTECTOMY     Social History: Social History   Socioeconomic History  . Marital status: Married    Spouse name: Shannon [name].   . Number of children: 0  . Years of education: Not on file  . Highest education level: Not on file  Occupational History  . Occupation: works in [place]: [employer]  . Financial resource strain: Not on file  . Food insecurity:    Worry: Not on file    Inability: Not on file  . Transportation needs:    Medical: Not on file    Non-medical: Not on file  Tobacco Use  . Smoking status: Never Smoker  . Smokeless tobacco: Never Used  Substance and Sexual Activity  . Alcohol use: Yes    Comment: socially  . Drug use: No  . Sexual activity: Not on file  Lifestyle  . Physical activity:     Days per week: Not on file    Minutes per session: Not on file  . Stress: Not on file  Relationships  . Social connections:    Talks on phone: Not on file    Gets together: Not on file    Attends religious service: Not on file    Active member of club or organization: Not on file    Attends meetings of clubs or organizations: Not on file    Relationship status: Not on file  Other Topics Concern  . 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 No children. 3 caffeinated drinks daily. No regular exercise.   Family History: Family History  Problem Relation Age of Onset  . Drug abuse Brother   . Diabetes type II Unknown        aunt  . Hyperlipidemia Sister   . Cirrhosis Sister        non alcoholic fatty cirrhosis of the liver  . [name] Father        prostate  . Prostate [name] Father   . Colon [name] Sister   . Esophageal [name] Neg Hx   . Stomach [name] Neg Hx   . Ulcerative colitis Neg Hx   . Colon polyps Neg Hx    Allergies: Allergies  Allergen Reactions  .  Adhesive [Tape]   . Penicillins    Medications: See med rec.  Review of Systems: No headache, visual changes, nausea, vomiting, diarrhea, constipation, dizziness, abdominal pain, skin rash, fevers, chills, night sweats, weight loss, swollen lymph nodes, body aches, joint swelling, muscle aches, chest pain, shortness of breath, mood changes, visual or auditory hallucinations.   Objective:   General: Well Developed, well nourished, and in no acute distress.  Neuro:  Extra-ocular muscles intact, able to move all 4 extremities, sensation grossly intact.  Deep tendon reflexes tested were normal. Psych: Alert and oriented, mood congruent with affect. ENT:  Ears and nose appear unremarkable.  Hearing grossly normal. Neck: Unremarkable overall appearance, trachea midline.  No visible thyroid enlargement. Eyes: Conjunctivae and lids appear  unremarkable.  Pupils equal and round. Skin: Warm and dry, no rashes noted.  Cardiovascular: Pulses palpable, no extremity edema. Left knee: Minimal swelling, tenderness at the medial joint line ROM normal in flexion and extension and lower leg rotation. Ligaments with solid consistent endpoints including ACL, PCL, LCL, MCL. Negative Mcmurray's and provocative meniscal tests. Non painful patellar compression. Patellar and quadriceps tendons unremarkable. Hamstring and quadriceps strength is normal. Right hip: ROM IR: 60 Deg, ER: 60 Deg, Flexion: 120 Deg, Extension: 100 Deg, Abduction: 45 Deg, Adduction: 45 Deg Strength IR: 5/5, ER: 5/5, Flexion: 5/5, Extension: 5/5, Abduction: 5/5, Adduction: 5/5 Pelvic alignment unremarkable to inspection and palpation. Standing hip rotation and gait without trendelenburg / unsteadiness. Greater trochanter with only minimal tenderness, somewhat more deep in the buttock at the piriformis. No tenderness over piriformis. No SI joint tenderness and normal minimal SI movement.  Procedure: Real-time Ultrasound Guided Injection of left knee Device: GE Logiq E  Verbal informed consent obtained.  Time-out conducted.  Noted no overlying erythema, induration, or other signs of local infection.  Skin prepped in a sterile fashion.  Local anesthesia: Topical Ethyl chloride.  With sterile technique and under real time ultrasound guidance: 1 cc kenalog 40, 2 cc lidocaine, 2 cc bupivacaine injected easily Completed without difficulty  Pain immediately resolved suggesting accurate placement of the medication.  Advised to call if fevers/chills, erythema, induration, drainage, or persistent bleeding.  Images permanently stored and available for review in the ultrasound unit.  Impression: Technically successful ultrasound guided injection.  Impression and Recommendations:   This case required medical decision making of moderate complexity.  Hip pain, acute,  right Piriformis and versus trochanteric bursitis, no sciatica. X-rays, meloxicam, rehab exercises, return in 1 month, MRI if no better.  Some SI joint degenerative changes, this can be an interventional target too if still painful at the followup visit.  Primary osteoarthritis of left knee Injection, x-rays. Return in 1 month. Suspect degenerative meniscal tear.  ___________________________________________ Shannon [name]. Dianah Field, M.D., ABFM., CAQSM. Primary Care and [place] Instructor of [place] of [facility] of Medicine

## 2017-12-08 NOTE — Assessment & Plan Note (Signed)
Injection, x-rays. Return in 1 month. Suspect degenerative meniscal tear.

## 2017-12-09 ENCOUNTER — Ambulatory Visit (HOSPITAL_BASED_OUTPATIENT_CLINIC_OR_DEPARTMENT_OTHER)
Admission: RE | Admit: 2017-12-09 | Discharge: 2017-12-09 | Disposition: A | Discharge status: Home / Self Care | Payer: Managed Care, Other (non HMO) | Source: Ambulatory Visit | Attending: Family Medicine | Admitting: Family Medicine

## 2017-12-09 DIAGNOSIS — I700 Atherosclerosis of aorta: Secondary | ICD-10-CM | POA: Diagnosis not present

## 2017-12-09 DIAGNOSIS — I10 Essential (primary) hypertension: Secondary | ICD-10-CM | POA: Diagnosis not present

## 2017-12-09 DIAGNOSIS — E785 Hyperlipidemia, unspecified: Secondary | ICD-10-CM | POA: Diagnosis not present

## 2017-12-09 DIAGNOSIS — I341 Nonrheumatic mitral (valve) prolapse: Secondary | ICD-10-CM

## 2017-12-09 NOTE — Progress Notes (Signed)
Echocardiogram 2D Echocardiogram has been performed.  Joelene Millin 12/09/2017, 12:04 PM

## 2018-01-05 ENCOUNTER — Ambulatory Visit (INDEPENDENT_AMBULATORY_CARE_PROVIDER_SITE_OTHER): Payer: Managed Care, Other (non HMO) | Admitting: Sports Medicine

## 2018-01-05 DIAGNOSIS — M1712 Unilateral primary osteoarthritis, left knee: Secondary | ICD-10-CM

## 2018-01-05 DIAGNOSIS — M25551 Pain in right hip: Secondary | ICD-10-CM

## 2018-01-05 MED ORDER — DICLOFENAC SODIUM 2 % TD SOLN: 2.0000 | Freq: Two times a day (BID) | TRANSDERMAL | 11 refills | Status: DC

## 2018-01-05 NOTE — Assessment & Plan Note (Signed)
Piriformis syndrome versus trochanteric bursitis without sciatica. X-rays did show some SI joint degenerative changes worse on the left than the right. She has done well with rehab exercises, return as needed for this.

## 2018-01-05 NOTE — Assessment & Plan Note (Signed)
Good response to left knee injection at the last visit, 80% pain relief, still has a bit of pain. She can do ibuprofen as needed, adding topical Pennsaid (diclofenac 2% topical). Return to see me as needed Continue with rehab exercises.

## 2018-01-05 NOTE — Progress Notes (Signed)
Subjective:    CC: Follow-up  HPI: This is a pleasant 62 year old female, I saw her a month ago for hip and knee pain.  We diagnosed her with piriformis syndrome versus trochanteric bursitis, she did some rehab exercises and this resolved.  We also injected her left knee, x-ray showed moderate arthritis.  She has about 80% improvement after the injection, only 20% remaining pain which is controlled fairly well with oral ibuprofen.  No mechanical symptoms, no new trauma, happy with how things are going.  I reviewed the past medical history, family history, social history, surgical history, and allergies today and no changes were needed.  Please see the problem list section below in epic for further details.  Past Medical History: Past Medical History:  Diagnosis Date  . Arthritis    left knee   . GERD (gastroesophageal reflux disease)   . Heart murmur   . History of hysterectomy 1995  . Hyperlipidemia   . Hypertension   . MVP (mitral valve prolapse)    Past Surgical History: Past Surgical History:  Procedure Laterality Date  . ABDOMINAL HYSTERECTOMY  1995  . CHOLECYSTECTOMY     Social History: Social History   Socioeconomic History  . Marital status: Married    Spouse name: [name].   . Number of children: 0  . Years of education: Not on file  . Highest education level: Not on file  Occupational History  . Occupation: works in [place]: [employer]  . Financial resource strain: Not on file  . Food insecurity:    Worry: Not on file    Inability: Not on file  . Transportation needs:    Medical: Not on file    Non-medical: Not on file  Tobacco Use  . Smoking status: Never Smoker  . Smokeless tobacco: Never Used  Substance and Sexual Activity  . Alcohol use: Yes    Comment: socially  . Drug use: No  . Sexual activity: Not on file  Lifestyle  . Physical activity:    Days per week: Not on file    Minutes per session: Not on file  . Stress:  Not on file  Relationships  . Social connections:    Talks on phone: Not on file    Gets together: Not on file    Attends religious service: Not on file    Active member of club or organization: Not on file    Attends meetings of clubs or organizations: Not on file    Relationship status: Not on file  Other Topics Concern  . 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 3 caffeinated drinks daily. No regular exercise.   Family History: Family History  Problem Relation Age of Onset  . Drug abuse Brother   . Diabetes type II Unknown        aunt  . Hyperlipidemia Sister   . Cirrhosis Sister        non alcoholic fatty cirrhosis of the liver  . Cancer Father        prostate  . Prostate cancer Father   . Colon cancer Sister   . Esophageal cancer Neg Hx   . Stomach cancer Neg Hx   . Ulcerative colitis Neg Hx   . Colon polyps Neg Hx    Allergies: Allergies  Allergen Reactions  . Adhesive [Tape]   .  Penicillins    Medications: See med rec.  Review of Systems: No fevers, chills, night sweats, weight loss, chest pain, or shortness of breath.   Objective:    General: Well Developed, well nourished, and in no acute distress.  Neuro: Alert and oriented x3, extra-ocular muscles intact, sensation grossly intact.  HEENT: Normocephalic, atraumatic, pupils equal round reactive to light, neck supple, no masses, no lymphadenopathy, thyroid nonpalpable.  Skin: Warm and dry, no rashes. Cardiac: Regular rate and rhythm, no murmurs rubs or gallops, no lower extremity edema.  Respiratory: Clear to auscultation bilaterally. Not using accessory muscles, speaking in full sentences.  Impression and Recommendations:    Primary osteoarthritis of left knee Good response to left knee injection at the last visit, 80% pain relief, still has a bit of pain. She can do ibuprofen as needed, adding topical  Pennsaid (diclofenac 2% topical). Return to see me as needed Continue with rehab exercises.  Hip pain, acute, right Piriformis syndrome versus trochanteric bursitis without sciatica. X-rays did show some SI joint degenerative changes worse on the left than the right. She has done well with rehab exercises, return as needed for this. ___________________________________________ Gwen Her. Dianah Field, M.D., ABFM., CAQSM. Primary Care and [place] Instructor of [place] of [facility] of Medicine

## 2018-02-22 ENCOUNTER — Ambulatory Visit (INDEPENDENT_AMBULATORY_CARE_PROVIDER_SITE_OTHER): Payer: Managed Care, Other (non HMO) | Admitting: Family Medicine

## 2018-02-22 ENCOUNTER — Encounter: Payer: Self-pay | Admitting: Family Medicine

## 2018-02-22 VITALS — BP 126/71 | HR 82 | Ht 63.0 in | Wt 213.0 lb

## 2018-02-22 DIAGNOSIS — I10 Essential (primary) hypertension: Secondary | ICD-10-CM

## 2018-02-22 DIAGNOSIS — Z800 Family history of malignant neoplasm of digestive organs: Secondary | ICD-10-CM | POA: Diagnosis not present

## 2018-02-22 NOTE — Progress Notes (Signed)
Subjective:    CC: HTN  HPI:  Hypertension- Pt denies chest pain, SOB, dizziness, or heart palpitations.  Taking meds as directed w/o problems.  Denies medication side effects.  She does walk about once or twice a week for exercise.  Her last colonoscopy was about a year ago in November 2018.  She has 1 sister diagnosed with colon cancer at age 60 and now her other sister was just diagnosed this spring and just had surgery and she is 52.  She wondered if that would affect her need for repeat screening colonoscopy.  She was told to come back in 5 years.  Past medical history, Surgical history, Family history not pertinant except as noted below, Social history, Allergies, and medications have been entered into the medical record, reviewed, and corrections made.   Review of Systems: No fevers, chills, night sweats, weight loss, chest pain, or shortness of breath.   BP 126/71   Pulse 82   Ht 5\' 3"  (1.6 m)   Wt 213 lb (96.6 kg)   SpO2 99%   BMI 37.73 kg/m     Allergies  Allergen Reactions  . Adhesive [Tape]   . Penicillins     Past Medical History:  Diagnosis Date  . Arthritis    left knee   . GERD (gastroesophageal reflux disease)   . Heart murmur   . History of hysterectomy 1995  . Hyperlipidemia   . Hypertension   . MVP (mitral valve prolapse)     Past Surgical History:  Procedure Laterality Date  . ABDOMINAL HYSTERECTOMY  1995  . CHOLECYSTECTOMY      Social History   Socioeconomic History  . Marital status: Married    Spouse name: [name].   . Number of children: 0  . Years of education: Not on file  . Highest education level: Not on file  Occupational History  . Occupation: works in [place]: [name]  . Financial resource strain: Not on file  . Food insecurity:    Worry: Not on file    Inability: Not on file  . Transportation needs:    Medical: Not on file    Non-medical: Not on file  Tobacco Use  . Smoking status: Never  Smoker  . Smokeless tobacco: Never Used  Substance and Sexual Activity  . Alcohol use: Yes    Comment: socially  . Drug use: No  . Sexual activity: Not on file  Lifestyle  . Physical activity:    Days per week: Not on file    Minutes per session: Not on file  . Stress: Not on file  Relationships  . Social connections:    Talks on phone: Not on file    Gets together: Not on file    Attends religious service: Not on file    Active member of club or organization: Not on file    Attends meetings of clubs or organizations: Not on file    Relationship status: Not on file  . Intimate partner violence:    Fear of current or ex partner: Not on file    Emotionally abused: Not on file    Physically abused: Not on file    Forced sexual activity: Not on file  Other Topics Concern  . 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 Married married to [name]. No children. 3 caffeinated drinks daily. No regular exercise.  Family History  Problem Relation Age of Onset  . Drug abuse Brother   . Diabetes type II Unknown        aunt  . Hyperlipidemia Sister   . Colon cancer Sister 14  . Cirrhosis Sister        non alcoholic fatty cirrhosis of the liver  . Cancer Father        prostate  . Prostate cancer Father   . Colon cancer Sister 13  . Esophageal cancer Neg Hx   . Stomach cancer Neg Hx   . Ulcerative colitis Neg Hx   . Colon polyps Neg Hx     Outpatient Encounter Medications as of 02/22/2018  Medication Sig  . cholecalciferol (VITAMIN D) 1000 units tablet Take 1,000 Units by mouth daily.  . Diclofenac Sodium 2 % SOLN Place 2 sprays onto the skin 2 (two) times daily.  . famotidine (PEPCID AC) 10 MG chewable tablet Chew 10 mg by mouth 2 (two) times daily.  Marland Kitchen losartan-hydrochlorothiazide (HYZAAR) 100-25 MG tablet TAKE ONE TABLET BY MOUTH DAILY  . meloxicam (MOBIC) 15 MG tablet One tab PO qAM with breakfast for 2 weeks, then daily  prn pain.  . Multiple Vitamin (MULTIVITAMIN) tablet Take 1 tablet by mouth daily.     No facility-administered encounter medications on file as of 02/22/2018.     Objective:    General: Well Developed, well nourished, and in no acute distress.  Neuro: Alert and oriented x3, extra-ocular muscles intact, sensation grossly intact.  HEENT: Normocephalic, atraumatic  Skin: Warm and dry, no rashes. Cardiac: Regular rate and rhythm, no murmurs rubs or gallops, no lower extremity edema.  Respiratory: Clear to auscultation bilaterally. Not using accessory muscles, speaking in full sentences.   Impression and Recommendations:    HTN - Well controlled. Continue current regimen. Follow up in  6 months.    Fam hx of colon cancer -check with Dr. Havery Moros if having to first-degree relatives from colon cancer affects screening recall protocol.

## 2018-03-18 ENCOUNTER — Encounter: Payer: Self-pay | Admitting: Sports Medicine

## 2018-03-18 ENCOUNTER — Ambulatory Visit (INDEPENDENT_AMBULATORY_CARE_PROVIDER_SITE_OTHER): Payer: Managed Care, Other (non HMO) | Admitting: Sports Medicine

## 2018-03-18 DIAGNOSIS — M1712 Unilateral primary osteoarthritis, left knee: Secondary | ICD-10-CM

## 2018-03-18 MED ORDER — TRAMADOL HCL 50 MG PO TABS: 50.0000 mg | ORAL_TABLET | Freq: Three times a day (TID) | ORAL | 0 refills | Status: DC | PRN

## 2018-03-18 NOTE — Assessment & Plan Note (Signed)
Only a 1 month response to a steroid injection into the left knee. Adding tramadol for use until we get Orthovisc approved. She does have a 3-4 cm leg length discrepancy, right longer than left, heel lift given to place on the bottom of her orthotic. Return to see me to start Visco supplementation when approved.

## 2018-03-18 NOTE — Progress Notes (Signed)
Subjective:    CC: Knee pain  HPI: This is a pleasant 62 year old female, I been treating her for left knee osteoarthritis, she has had steroid injection about 3 months ago that only provided a month of relief.  Has never had Visco supplementation, pain is at the medial joint line, moderate, persistent without radiation or mechanical symptoms.  I reviewed the past medical history, family history, social history, surgical history, and allergies today and no changes were needed.  Please see the problem list section below in epic for further details.  Past Medical History: Past Medical History:  Diagnosis Date  . Arthritis    left knee   . GERD (gastroesophageal reflux disease)   . Heart murmur   . History of hysterectomy 1995  . Hyperlipidemia   . Hypertension   . MVP (mitral valve prolapse)    Past Surgical History: Past Surgical History:  Procedure Laterality Date  . ABDOMINAL HYSTERECTOMY  1995  . CHOLECYSTECTOMY     Social History: Social History   Socioeconomic History  . Marital status: Married    Spouse name: [name].   . Number of children: 0  . Years of education: Not on file  . Highest education level: Not on file  Occupational History  . Occupation: works in [place]: [employer]  . Financial resource strain: Not on file  . Food insecurity:    Worry: Not on file    Inability: Not on file  . Transportation needs:    Medical: Not on file    Non-medical: Not on file  Tobacco Use  . Smoking status: Never Smoker  . Smokeless tobacco: Never Used  Substance and Sexual Activity  . Alcohol use: Yes    Comment: socially  . Drug use: No  . Sexual activity: Not on file  Lifestyle  . Physical activity:    Days per week: Not on file    Minutes per session: Not on file  . Stress: Not on file  Relationships  . Social connections:    Talks on phone: Not on file    Gets together: Not on file    Attends religious service: Not on file   Active member of club or organization: Not on file    Attends meetings of clubs or organizations: Not on file    Relationship status: Not on file  Other Topics Concern  . 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 No children. 3 caffeinated drinks daily. No regular exercise.   Family History: Family History  Problem Relation Age of Onset  . Drug abuse Brother   . Diabetes type II Unknown        aunt  . Hyperlipidemia Sister   . Colon cancer Sister 3  . Cirrhosis Sister        non alcoholic fatty cirrhosis of the liver  . Cancer Father        prostate  . Prostate cancer Father   . Colon cancer Sister 57  . Esophageal cancer Neg Hx   . Stomach cancer Neg Hx   . Ulcerative colitis Neg Hx   . Colon polyps Neg Hx    Allergies: Allergies  Allergen Reactions  . Adhesive [Tape]   . Penicillins    Medications: See med rec.  Review of Systems: No fevers, chills, night sweats, weight loss, chest pain, or shortness of breath.  Objective:    General: Well Developed, well nourished, and in no acute distress.  Neuro: Alert and oriented x3, extra-ocular muscles intact, sensation grossly intact.  HEENT: Normocephalic, atraumatic, pupils equal round reactive to light, neck supple, no masses, no lymphadenopathy, thyroid nonpalpable.  Skin: Warm and dry, no rashes. Cardiac: Regular rate and rhythm, no murmurs rubs or gallops, no lower extremity edema.  Respiratory: Clear to auscultation bilaterally. Not using accessory muscles, speaking in full sentences.  Impression and Recommendations:    Primary osteoarthritis of left knee Only a 1 month response to a steroid injection into the left knee. Adding tramadol for use until we get Orthovisc approved. She does have a 3-4 cm leg length discrepancy, right longer than left, heel lift given to place on the bottom of her orthotic. Return to see me to start  Visco supplementation when approved.  ___________________________________________ Gwen Her. Dianah Field, M.D., ABFM., CAQSM. Primary Care and [place] Instructor of [place] [facility]

## 2018-03-19 ENCOUNTER — Telehealth: Payer: Self-pay | Admitting: Sports Medicine

## 2018-03-19 NOTE — Telephone Encounter (Signed)
  Admin (20611): N/A Product [phone]): N/A  This is the patient's Specialist copay amount for this policy, when applicable. Unknown means this information was not supplied by the insurance carrier. Coinsurance Admin (20611): N/A Product [phone]): 20.0%   Left message for patient to call back.

## 2018-03-19 NOTE — Telephone Encounter (Signed)
-----   Message from Tasia Catchings, [place] sent at 03/19/2018  9:59 AM EDT ----- Information has been submitted to Orthovisc and awaiting determination.   ----- Message ----- From: Silverio Decamp, MD Sent: 03/18/2018   2:42 PM To: Beatris Ship Mabe, CMA  Left knee osteoarthritis, failed NSAIDs, rehab, orthotics, steroid injections, Orthovisc approval please for left knee. ___________________________________________ Gwen Her. Dianah Field, M.D., ABFM., CAQSM. Primary Care and [place] Instructor of [place] [facility]

## 2018-03-24 NOTE — Telephone Encounter (Signed)
Left message for patient to call back about note below.  

## 2018-03-26 NOTE — Telephone Encounter (Signed)
No return call. Closing encounter.

## 2018-04-01 ENCOUNTER — Ambulatory Visit: Payer: Managed Care, Other (non HMO) | Admitting: Family Medicine

## 2018-04-06 NOTE — Telephone Encounter (Signed)
Patient called back and wanted to check the status of the injections. I gave her the estimate and she stated that she is not able to afford that at this time. Patient will call back at a later date if her knees get worse to follow up with Dr. Lillie Fragmin.

## 2018-05-18 ENCOUNTER — Telehealth: Payer: Self-pay | Admitting: Family Medicine

## 2018-05-18 NOTE — Telephone Encounter (Signed)
Please call pt: Dr. Havery Moros still recommend colonoscopy rpeat 5 years unless you sister who was recently dx was found to have a hereditary type.  We can also consider refer her to a geneticist to see if they would recommend gene testing to see if she may be at higher risk for hereditary Colon Ca.

## 2018-05-19 NOTE — Telephone Encounter (Signed)
Pt would like to do the colonoscopy. She doesn't think that the genetic testing would be covered under her insurance.Shannon [name], [place] Crocker, CMA

## 2018-05-21 ENCOUNTER — Ambulatory Visit (INDEPENDENT_AMBULATORY_CARE_PROVIDER_SITE_OTHER): Payer: Managed Care, Other (non HMO) | Admitting: Family Medicine

## 2018-05-21 ENCOUNTER — Encounter: Payer: Self-pay | Admitting: Family Medicine

## 2018-05-21 DIAGNOSIS — Z23 Encounter for immunization: Secondary | ICD-10-CM

## 2018-05-21 NOTE — Addendum Note (Signed)
Addended by: Teddy Spike on: 05/21/2018 03:24 PM   Modules accepted: Level of Service

## 2018-05-21 NOTE — Progress Notes (Signed)
Pt here for flu shot. Afebrile,no recent illness. Vaccination given, pt tolerated well..Arihant Pennings [place]etta, CMA  

## 2018-09-07 ENCOUNTER — Encounter: Payer: Self-pay | Admitting: Family Medicine

## 2018-09-07 ENCOUNTER — Ambulatory Visit (INDEPENDENT_AMBULATORY_CARE_PROVIDER_SITE_OTHER): Payer: Managed Care, Other (non HMO) | Admitting: Family Medicine

## 2018-09-07 VITALS — BP 130/78 | HR 85 | Ht 63.0 in | Wt 206.0 lb

## 2018-09-07 DIAGNOSIS — L720 Epidermal cyst: Secondary | ICD-10-CM | POA: Diagnosis not present

## 2018-09-07 DIAGNOSIS — I10 Essential (primary) hypertension: Secondary | ICD-10-CM | POA: Diagnosis not present

## 2018-09-07 NOTE — Progress Notes (Signed)
   Subjective:    Patient ID: Shannon [name], female    DOB: 29-Nov-1955, 63 y.o.   MRN: [id number]  HPI 63 -year-old female comes in today complaining of a lesion on her mid upper back.  She noticed it about 6 months ago and feels like it has been getting larger.  No pain or tenderness.   Hypertension- Pt denies chest pain, SOB, dizziness, or heart palpitations.  Taking meds as directed w/o problems.  Denies medication side effects.      Review of Systems     Objective:   Physical Exam Vitals signs reviewed.  Constitutional:      Appearance: She is well-developed.  HENT:     Head: Normocephalic and atraumatic.  Eyes:     Conjunctiva/sclera: Conjunctivae normal.  Cardiovascular:     Rate and Rhythm: Normal rate.  Pulmonary:     Effort: Pulmonary effort is normal.  Musculoskeletal:     Comments: She has an epidermal cyst on her mid back measuring approximately 1 x 1-1/2 cm.  No significant erythema.  No drainage.  Skin:    General: Skin is dry.     Coloration: Skin is not pale.  Neurological:     Mental Status: She is alert and oriented to person, place, and time.  Psychiatric:        Behavior: Behavior normal.        Assessment & Plan:  Epidermal cyst-discussed diagnosis this is a benign lesion but they can often get larger.  Recommend definitive removal.  She was scheduled with Dr. Dianah Field for this.  Hypertension-well controlled.  Continue current regimen.  Due for CMP and lipid panel.

## 2018-09-07 NOTE — Patient Instructions (Signed)
Please schedule with DR. T for removal.

## 2018-09-10 LAB — COMPLETE METABOLIC PANEL WITH GFR
AG Ratio: 1.9 (calc) (ref 1.0–2.5)
ALKALINE PHOSPHATASE (APISO): 52 U/L (ref 33–130)
ALT: 25 U/L (ref 6–29)
AST: 22 U/L (ref 10–35)
Albumin: 4.3 g/dL (ref 3.6–5.1)
BUN: 15 mg/dL (ref 7–25)
CALCIUM: 9.5 mg/dL (ref 8.6–10.4)
CO2: 26 mmol/L (ref 20–32)
Chloride: 104 mmol/L (ref 98–110)
Creat: 0.77 mg/dL (ref 0.50–0.99)
GFR, Est African American: 97 mL/min/{1.73_m2} (ref >=60)
GFR, Est Non African American: 84 mL/min/{1.73_m2} (ref >=60)
GLUCOSE: 91 mg/dL (ref 65–99)
Globulin: 2.3 g/dL (calc) (ref 1.9–3.7)
Potassium: 4 mmol/L (ref 3.5–5.3)
Sodium: 139 mmol/L (ref 135–146)
Total Bilirubin: 1.2 mg/dL (ref 0.2–1.2)
Total Protein: 6.6 g/dL (ref 6.1–8.1)

## 2018-09-10 LAB — LIPID PANEL
CHOL/HDL RATIO: 5.1 (calc) — AB (ref <5.0)
CHOLESTEROL: 230 mg/dL — AB (ref <200)
HDL: 45 mg/dL — AB (ref >50)
LDL CHOLESTEROL (CALC): 146 mg/dL — AB
Non-HDL Cholesterol (Calc): 185 mg/dL (calc) — ABNORMAL HIGH (ref <130)
TRIGLYCERIDES: 242 mg/dL — AB (ref <150)

## 2018-10-01 ENCOUNTER — Telehealth: Payer: Self-pay | Admitting: *Deleted

## 2018-10-01 NOTE — Telephone Encounter (Signed)
Form completed,faxed,confirmation received and scanned into patient's chart..Lewanda Perea Lynetta, CMA  

## 2018-10-18 ENCOUNTER — Ambulatory Visit: Payer: Managed Care, Other (non HMO) | Admitting: Family Medicine

## 2018-10-21 ENCOUNTER — Ambulatory Visit: Payer: Managed Care, Other (non HMO) | Admitting: Family Medicine

## 2018-11-01 ENCOUNTER — Encounter: Payer: Self-pay | Admitting: Family Medicine

## 2018-11-01 ENCOUNTER — Ambulatory Visit (INDEPENDENT_AMBULATORY_CARE_PROVIDER_SITE_OTHER): Payer: Managed Care, Other (non HMO) | Admitting: Family Medicine

## 2018-11-01 VITALS — BP 142/72 | HR 88 | Ht 63.0 in | Wt 204.0 lb

## 2018-11-01 DIAGNOSIS — R1013 Epigastric pain: Secondary | ICD-10-CM

## 2018-11-01 NOTE — Progress Notes (Signed)
Acute Office Visit  Subjective:    Patient ID: Shannon [name], female    DOB: [birth year], 63 y.o.   MRN: [id number]  Chief Complaint  Patient presents with  . Abdominal Pain    she reports that she had this same pain 20 yrs ago and was tx w/prilosec she reports that the feeling is uncomfortable and it comes and goes.she has tried nexium  . Cyst    R side of head at hairline     HPI Patient is in today for Epigastric pain - she reports that she had this same pain 20 yrs ago and was tx w/prilosec she reports that the feeling is uncomfortable and it comes and goes for the last year or so.  She has tried nexium does not really seem to help.  She says when her husband was in the hospital and she barely ate for 6 days was the best that it actually felt.  It is more uncomfortable when she eats but is not necessarily immediately after she eats.  She denies any changes in bowel movements or blood in the urine or stool.  She denies any heartburn or nausea.  She did take a Nexium yesterday.  She had her gallbladder removed in 1995.  No other known triggers or alleviating symptoms.    Past Medical History:  Diagnosis Date  . Arthritis    left knee   . GERD (gastroesophageal reflux disease)   . Heart murmur   . History of hysterectomy 1995  . Hyperlipidemia   . Hypertension   . MVP (mitral valve prolapse)     Past Surgical History:  Procedure Laterality Date  . ABDOMINAL HYSTERECTOMY  1995  . CHOLECYSTECTOMY      Family History  Problem Relation Age of Onset  . Drug abuse Brother   . Diabetes type II Unknown        aunt  . Hyperlipidemia Sister   . Colon cancer Sister 33  . Cirrhosis Sister        non alcoholic fatty cirrhosis of the liver  . Cancer Father        prostate  . Prostate cancer Father   . Colon cancer Sister 69  . Esophageal cancer Neg Hx   . Stomach cancer Neg Hx   . Ulcerative colitis Neg Hx   . Colon polyps Neg Hx     Social History   Socioeconomic  History  . Marital status: Married    Spouse name: [name].   . Number of children: 0  . Years of education: Not on file  . Highest education level: Not on file  Occupational History  . Occupation: works in [place]: [employer]  . Financial resource strain: Not on file  . Food insecurity:    Worry: Not on file    Inability: Not on file  . Transportation needs:    Medical: Not on file    Non-medical: Not on file  Tobacco Use  . Smoking status: Never Smoker  . Smokeless tobacco: Never Used  Substance and Sexual Activity  . Alcohol use: Yes    Comment: socially  . Drug use: No  . Sexual activity: Not on file  Lifestyle  . Physical activity:    Days per week: Not on file    Minutes per session: Not on file  . Stress: Not on file  Relationships  . Social connections:    Talks on phone: Not on file  Gets together: Not on file    Attends religious service: Not on file    Active member of club or organization: Not on file    Attends meetings of clubs or organizations: Not on file    Relationship status: Not on file  . Intimate partner violence:    Fear of current or ex partner: Not on file    Emotionally abused: Not on file    Physically abused: Not on file    Forced sexual activity: Not on file  Other Topics Concern  . 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 3 caffeinated drinks daily. No regular exercise.    Outpatient Medications Prior to Visit  Medication Sig Dispense Refill  . losartan-hydrochlorothiazide (HYZAAR) 100-25 MG tablet TAKE ONE TABLET BY MOUTH DAILY 90 tablet 2   No facility-administered medications prior to visit.     Allergies  Allergen Reactions  . Adhesive [Tape]   . Penicillins     ROS     Objective:    Physical Exam  Constitutional: She is oriented to person, place, and time. She appears well-developed and  well-nourished.  HENT:  Head: Normocephalic and atraumatic.  Cardiovascular: Normal rate, regular rhythm and normal heart sounds.  Pulmonary/Chest: Effort normal and breath sounds normal.  Abdominal: Soft. Bowel sounds are normal. She exhibits no distension and no mass. There is no abdominal tenderness. There is no rebound and no guarding.  Points to her area of pain right at the end of the sternum.  Neurological: She is alert and oriented to person, place, and time.  Skin: Skin is warm and dry.  Psychiatric: She has a normal mood and affect. Her behavior is normal.    BP (!) 142/72   Pulse 88   Ht 5\' 3"  (1.6 m)   Wt 204 lb (92.5 kg)   SpO2 99%   BMI 36.14 kg/m  Wt Readings from Last 3 Encounters:  11/01/18 204 lb (92.5 kg)  09/07/18 206 lb (93.4 kg)  03/18/18 212 lb (96.2 kg)    Health Maintenance Due  Topic Date Due  . HIV Screening  07/03/1973  . MAMMOGRAM  10/21/2018    There are no preventive care reminders to display for this patient.   Lab Results  Component Value Date   TSH 1.53 10/15/2016   Lab Results  Component Value Date   WBC 9.9 05/28/2017   HGB 14.1 05/28/2017   HCT 40.3 05/28/2017   MCV 92.4 05/28/2017   PLT 345 05/28/2017   Lab Results  Component Value Date   NA 139 09/10/2018   K 4.0 09/10/2018   CO2 26 09/10/2018   GLUCOSE 91 09/10/2018   BUN 15 09/10/2018   CREATININE 0.77 09/10/2018   BILITOT 1.2 09/10/2018   ALKPHOS 52 10/15/2016   AST 22 09/10/2018   ALT 25 09/10/2018   PROT 6.6 09/10/2018   ALBUMIN 4.0 10/15/2016   CALCIUM 9.5 09/10/2018   Lab Results  Component Value Date   CHOL 230 (H) 09/10/2018   Lab Results  Component Value Date   HDL 45 (L) 09/10/2018   Lab Results  Component Value Date   LDLCALC 146 (H) 09/10/2018   Lab Results  Component Value Date   TRIG 242 (H) 09/10/2018   Lab Results  Component Value Date   CHOLHDL 5.1 (H) 09/10/2018   No results found for: HGBA1C  Assessment & Plan:    Problem List Items Addressed This Visit    None    Visit Diagnoses    Epigastric pain    -  Primary   Relevant Orders   Ambulatory referral to Gastroenterology     I do think this is probably gastric related it could either be gastritis, ulcer, or possibly hiatal hernia.  She has had her gallbladder removed which makes it less likely but she could still have a biliary stone.  I would like to refer to GI for further work-up at this point.  Did consider doing H. pylori testing but she took a PPI yesterday.  Recent liver enzymes were normal.  No orders of the defined types were placed in this encounter.    Beatrice Lecher, MD

## 2018-11-09 ENCOUNTER — Other Ambulatory Visit: Payer: Self-pay | Admitting: Family Medicine

## 2018-11-09 DIAGNOSIS — I10 Essential (primary) hypertension: Secondary | ICD-10-CM

## 2018-12-07 ENCOUNTER — Telehealth: Payer: Managed Care, Other (non HMO) | Admitting: Gastroenterology

## 2019-01-05 ENCOUNTER — Ambulatory Visit (INDEPENDENT_AMBULATORY_CARE_PROVIDER_SITE_OTHER): Payer: Managed Care, Other (non HMO) | Admitting: Family Medicine

## 2019-01-05 ENCOUNTER — Encounter: Payer: Self-pay | Admitting: Family Medicine

## 2019-01-05 VITALS — BP 144/79 | HR 96 | Temp 99.2°F | Ht 63.0 in | Wt 209.0 lb

## 2019-01-05 DIAGNOSIS — Z1231 Encounter for screening mammogram for malignant neoplasm of breast: Secondary | ICD-10-CM | POA: Diagnosis not present

## 2019-01-05 DIAGNOSIS — J029 Acute pharyngitis, unspecified: Secondary | ICD-10-CM | POA: Diagnosis not present

## 2019-01-05 MED ORDER — AZITHROMYCIN 250 MG PO TABS: ORAL_TABLET | ORAL | 0 refills | Status: AC

## 2019-01-05 NOTE — Progress Notes (Signed)
Acute Office Visit  Subjective:    Patient ID: Shannon [name], female    DOB: May 22, 1956, 63 y.o.   MRN: [id number]  Chief Complaint  Patient presents with  . scratchy throat    pt reports that her sxs began about 3 wks ago she stated that her sxs are worse in the morning. she denies cough, or body aches, no n/s/c she has not had a fever but was running a low grade temp today in the office. taking claritin and she did have a     HPI Patient is in today for sore throat.  Symptoms x3 weeks.  Nuys any pain with eating or swallowing but says it is always worse in the morning.  She says the pain is mostly on the left side of her throat.  She says is always seems to be worse in the morning.  She denies any cough, body aches.  No fever, sweats, or chills.  She is mostly been taking Claritin for it. No ear pain. No GI sxs. She did have some headaches over the weekend.  She is mostly been staying at home.  She works at [employer] but has been out of work over the last month.  Nuys any swollen glands.  Past Medical History:  Diagnosis Date  . Arthritis    left knee   . GERD (gastroesophageal reflux disease)   . Heart murmur   . History of hysterectomy 1995  . Hyperlipidemia   . Hypertension   . MVP (mitral valve prolapse)     Past Surgical History:  Procedure Laterality Date  . ABDOMINAL HYSTERECTOMY  1995  . CHOLECYSTECTOMY      Family History  Problem Relation Age of Onset  . Drug abuse Brother   . Diabetes type II Unknown        aunt  . Hyperlipidemia Sister   . Colon cancer Sister 39  . Cirrhosis Sister        non alcoholic fatty cirrhosis of the liver  . Cancer Father        prostate  . Prostate cancer Father   . Colon cancer Sister 61  . Esophageal cancer Neg Hx   . Stomach cancer Neg Hx   . Ulcerative colitis Neg Hx   . Colon polyps Neg Hx     Social History   Socioeconomic History  . Marital status: Married    Spouse name: [name].   . Number of children:  0  . Years of education: Not on file  . Highest education level: Not on file  Occupational History  . Occupation: works in [place]: [employer]  . Financial resource strain: Not on file  . Food insecurity:    Worry: Not on file    Inability: Not on file  . Transportation needs:    Medical: Not on file    Non-medical: Not on file  Tobacco Use  . Smoking status: Never Smoker  . Smokeless tobacco: Never Used  Substance and Sexual Activity  . Alcohol use: Yes    Comment: socially  . Drug use: No  . Sexual activity: Not on file  Lifestyle  . Physical activity:    Days per week: Not on file    Minutes per session: Not on file  . Stress: Not on file  Relationships  . Social connections:    Talks on phone: Not on file    Gets together: Not on file  Attends religious service: Not on file    Active member of club or organization: Not on file    Attends meetings of clubs or organizations: Not on file    Relationship status: Not on file  . Intimate partner violence:    Fear of current or ex partner: Not on file    Emotionally abused: Not on file    Physically abused: Not on file    Forced sexual activity: Not on file  Other Topics Concern  . 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 Married married to [place]. No children. 3 caffeinated drinks daily. No regular exercise.    Outpatient Medications Prior to Visit  Medication Sig Dispense Refill  . losartan-hydrochlorothiazide (HYZAAR) 100-25 MG tablet TAKE ONE TABLET BY MOUTH DAILY 90 tablet 1  . Probiotic Product (PROBIOTIC DAILY PO) Take by mouth.     No facility-administered medications prior to visit.     Allergies  Allergen Reactions  . Adhesive [Tape]   . Penicillins     ROS     Objective:    Physical Exam  Constitutional: She is oriented to person, place, and time. She appears well-developed and well-nourished.  HENT:  Head:  Normocephalic and atraumatic.  Right Ear: External ear normal.  Left Ear: External ear normal.  Nose: Nose normal.  Mouth/Throat: Oropharynx is clear and moist.  TMs and canals are clear.   Eyes: Pupils are equal, round, and reactive to light. Conjunctivae and EOM are normal.  Neck: Neck supple. No thyromegaly present.  Cardiovascular: Normal rate, regular rhythm and normal heart sounds.  Pulmonary/Chest: Effort normal and breath sounds normal. She has no wheezes.  Lymphadenopathy:    She has no cervical adenopathy.  Neurological: She is alert and oriented to person, place, and time.  Skin: Skin is warm and dry.  Psychiatric: She has a normal mood and affect.    BP (!) 144/79   Pulse 96   Temp 99.2 F (37.3 C)   Ht 5\' 3"  (1.6 m)   Wt 209 lb (94.8 kg)   SpO2 99%   BMI 37.02 kg/m  Wt Readings from Last 3 Encounters:  01/05/19 209 lb (94.8 kg)  11/01/18 204 lb (92.5 kg)  09/07/18 206 lb (93.4 kg)    Health Maintenance Due  Topic Date Due  . HIV Screening  07/03/1973  . MAMMOGRAM  10/21/2018    There are no preventive care reminders to display for this patient.   Lab Results  Component Value Date   TSH 1.53 10/15/2016   Lab Results  Component Value Date   WBC 9.9 05/28/2017   HGB 14.1 05/28/2017   HCT 40.3 05/28/2017   MCV 92.4 05/28/2017   PLT 345 05/28/2017   Lab Results  Component Value Date   NA 139 09/10/2018   K 4.0 09/10/2018   CO2 26 09/10/2018   GLUCOSE 91 09/10/2018   BUN 15 09/10/2018   CREATININE 0.77 09/10/2018   BILITOT 1.2 09/10/2018   ALKPHOS 52 10/15/2016   AST 22 09/10/2018   ALT 25 09/10/2018   PROT 6.6 09/10/2018   ALBUMIN 4.0 10/15/2016   CALCIUM 9.5 09/10/2018   Lab Results  Component Value Date   CHOL 230 (H) 09/10/2018   Lab Results  Component Value Date   HDL 45 (L) 09/10/2018   Lab Results  Component Value Date   LDLCALC 146 (H) 09/10/2018   Lab Results  Component Value Date   TRIG 242 (H) 09/10/2018   Lab  Results  Component Value Date   CHOLHDL 5.1 (H) 09/10/2018   No results found for: HGBA1C     Assessment & Plan:   Problem List Items Addressed This Visit    None    Visit Diagnoses    Pharyngitis, unspecified etiology    -  Primary     Pharyngitis x3 weeks-unclear etiology it is worse on the left side.  It could be from postnasal drip though she denies any significant postnasal drip.  Could also be viral though 3 weeks is a little bit of a longer time to have a problem with this.  Could be allergy related but she has been taking her Claritin.  She did have some sinus headaches over the weekend some to go ahead and treat with azithromycin.  If she is not improving over the next week or 2 then plan will be to refer to ENT so that they can do direct evaluation with laryngoscopy.   Meds ordered this encounter  Medications  . azithromycin (ZITHROMAX) 250 MG tablet    Sig: 2 Ttabs PO on Day 1, then one a day x 4 days.    Dispense:  6 tablet    Refill:  0     Beatrice Lecher, MD

## 2019-01-05 NOTE — Addendum Note (Signed)
Addended by: Teddy Spike on: 01/05/2019 10:06 AM   Modules accepted: Orders

## 2019-01-13 ENCOUNTER — Other Ambulatory Visit: Payer: Self-pay

## 2019-01-13 ENCOUNTER — Ambulatory Visit (INDEPENDENT_AMBULATORY_CARE_PROVIDER_SITE_OTHER): Payer: Managed Care, Other (non HMO)

## 2019-01-13 DIAGNOSIS — Z1231 Encounter for screening mammogram for malignant neoplasm of breast: Secondary | ICD-10-CM | POA: Diagnosis not present

## 2019-01-20 ENCOUNTER — Ambulatory Visit: Payer: Managed Care, Other (non HMO) | Admitting: Gastroenterology

## 2019-02-08 ENCOUNTER — Ambulatory Visit (INDEPENDENT_AMBULATORY_CARE_PROVIDER_SITE_OTHER): Payer: Managed Care, Other (non HMO) | Admitting: Family Medicine

## 2019-02-08 ENCOUNTER — Encounter: Payer: Self-pay | Admitting: Family Medicine

## 2019-02-08 VITALS — BP 135/65 | HR 84 | Wt 215.0 lb

## 2019-02-08 DIAGNOSIS — J019 Acute sinusitis, unspecified: Secondary | ICD-10-CM

## 2019-02-08 MED ORDER — SULFAMETHOXAZOLE-TRIMETHOPRIM 800-160 MG PO TABS: 1.0000 | ORAL_TABLET | Freq: Two times a day (BID) | ORAL | 0 refills | Status: DC

## 2019-02-08 NOTE — Progress Notes (Signed)
Acute Office Visit  Subjective:    Patient ID: Shannon [name], female    DOB: 10/29/1955, 63 y.o.   MRN: [id number]  Chief Complaint  Patient presents with  . Sore Throat    HPI Patient is in today for persistent sore throat.  It still on her left side.  It is been going on for about 7 weeks now total.  Actually saw her 4 weeks ago for this.  She did take azithromycin and says that it did help but it did not completely resolve her symptoms.  She still just having a lot of postnasal drip particularly on that left sinus.  Is not really coming out of the front of her nose and feels like that left eye is also irritated and watering and the left side of her throat is still staying sore and scratchy and irritated.  She has not had any fever or chills.  No worsening symptoms.  No bad headaches.  No facial pressure or pain.  Past Medical History:  Diagnosis Date  . Arthritis    left knee   . GERD (gastroesophageal reflux disease)   . Heart murmur   . History of hysterectomy 1995  . Hyperlipidemia   . Hypertension   . MVP (mitral valve prolapse)     Past Surgical History:  Procedure Laterality Date  . ABDOMINAL HYSTERECTOMY  1995  . CHOLECYSTECTOMY      Family History  Problem Relation Age of Onset  . Drug abuse Brother   . Diabetes type II Other        aunt  . Hyperlipidemia Sister   . Colon cancer Sister 76  . Cirrhosis Sister        non alcoholic fatty cirrhosis of the liver  . Cancer Father        prostate  . Prostate cancer Father   . Colon cancer Sister 47  . Esophageal cancer Neg Hx   . Stomach cancer Neg Hx   . Ulcerative colitis Neg Hx   . Colon polyps Neg Hx     Social History   Socioeconomic History  . Marital status: Married    Spouse name: [name].   . Number of children: 0  . Years of education: Not on file  . Highest education level: Not on file  Occupational History  . Occupation: works in [place]: [employer]  .  Financial resource strain: Not on file  . Food insecurity    Worry: Not on file    Inability: Not on file  . Transportation needs    Medical: Not on file    Non-medical: Not on file  Tobacco Use  . Smoking status: Never Smoker  . Smokeless tobacco: Never Used  Substance and Sexual Activity  . Alcohol use: Yes    Comment: socially  . Drug use: No  . Sexual activity: Not on file  Lifestyle  . Physical activity    Days per week: Not on file    Minutes per session: Not on file  . Stress: Not on file  Relationships  . Social Herbalist on phone: Not on file    Gets together: Not on file    Attends religious service: Not on file    Active member of club or organization: Not on file    Attends meetings of clubs or organizations: Not on file    Relationship status: Not on file  . Intimate partner violence  Fear of current or ex partner: Not on file    Emotionally abused: Not on file    Physically abused: Not on file    Forced sexual activity: Not on file  Other Topics Concern  . 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 3 caffeinated drinks daily. No regular exercise.    Outpatient Medications Prior to Visit  Medication Sig Dispense Refill  . Cholecalciferol (VITAMIN D3) 25 MCG (1000 UT) CAPS Take 4 capsules by mouth daily.    Marland Kitchen losartan-hydrochlorothiazide (HYZAAR) 100-25 MG tablet TAKE ONE TABLET BY MOUTH DAILY 90 tablet 1  . Multiple Vitamins-Minerals (MULTIVITAMIN ADULT PO) Take 1 tablet by mouth daily.    . Probiotic Product (PROBIOTIC DAILY PO) Take by mouth.     No facility-administered medications prior to visit.     Allergies  Allergen Reactions  . Adhesive [Tape]   . Penicillins     ROS     Objective:    Physical Exam  Constitutional: She is oriented to person, place, and time. She appears well-developed and well-nourished.  HENT:  Head:  Normocephalic and atraumatic.  Right Ear: External ear normal.  Left Ear: External ear normal.  Nose: Nose normal.  Mouth/Throat: Oropharynx is clear and moist.  TMs and canals are clear.   Eyes: Pupils are equal, round, and reactive to light. Conjunctivae and EOM are normal.  Neck: Neck supple. No thyromegaly present.  Cardiovascular: Normal rate, regular rhythm and normal heart sounds.  Pulmonary/Chest: Effort normal and breath sounds normal. She has no wheezes.  Lymphadenopathy:    She has no cervical adenopathy.  Neurological: She is alert and oriented to person, place, and time.  Skin: Skin is warm and dry.  Psychiatric: She has a normal mood and affect.    BP 135/65   Pulse 84   Wt 215 lb (97.5 kg)   SpO2 99%   BMI 38.09 kg/m  Wt Readings from Last 3 Encounters:  02/08/19 215 lb (97.5 kg)  01/05/19 209 lb (94.8 kg)  11/01/18 204 lb (92.5 kg)    Health Maintenance Due  Topic Date Due  . HIV Screening  07/03/1973    There are no preventive care reminders to display for this patient.   Lab Results  Component Value Date   TSH 1.53 10/15/2016   Lab Results  Component Value Date   WBC 9.9 05/28/2017   HGB 14.1 05/28/2017   HCT 40.3 05/28/2017   MCV 92.4 05/28/2017   PLT 345 05/28/2017   Lab Results  Component Value Date   NA 139 09/10/2018   K 4.0 09/10/2018   CO2 26 09/10/2018   GLUCOSE 91 09/10/2018   BUN 15 09/10/2018   CREATININE 0.77 09/10/2018   BILITOT 1.2 09/10/2018   ALKPHOS 52 10/15/2016   AST 22 09/10/2018   ALT 25 09/10/2018   PROT 6.6 09/10/2018   ALBUMIN 4.0 10/15/2016   CALCIUM 9.5 09/10/2018   Lab Results  Component Value Date   CHOL 230 (H) 09/10/2018   Lab Results  Component Value Date   HDL 45 (L) 09/10/2018   Lab Results  Component Value Date   LDLCALC 146 (H) 09/10/2018   Lab Results  Component Value Date   TRIG 242 (H) 09/10/2018   Lab Results  Component Value Date   CHOLHDL 5.1 (H) 09/10/2018   No results  found for: HGBA1C  Assessment & Plan:   Problem List Items Addressed This Visit    None    Visit Diagnoses    Acute sinusitis, recurrence not specified, unspecified location    -  Primary   Relevant Medications   sulfamethoxazole-trimethoprim (BACTRIM DS) 800-160 MG tablet     Left-sided postnasal drip and sore throat.  At this point is been going on for 7 weeks and she did get some mild improvement after azithromycin some to put her on Bactrim for 2 weeks and she has a penicillin allergy.  I am also can have her start using some Flonase or Nasonex.  1 squirt in each nostril daily for those 2 weeks.  If at that point she is not improving then plan will be to refer her to ENT.  Exam was normal today so no abnormal findings.  Meds ordered this encounter  Medications  . sulfamethoxazole-trimethoprim (BACTRIM DS) 800-160 MG tablet    Sig: Take 1 tablet by mouth 2 (two) times daily.    Dispense:  28 tablet    Refill:  0     Beatrice Lecher, MD

## 2019-02-10 ENCOUNTER — Ambulatory Visit: Payer: Managed Care, Other (non HMO) | Admitting: Gastroenterology

## 2019-03-15 ENCOUNTER — Encounter: Payer: Self-pay | Admitting: Family Medicine

## 2019-03-15 ENCOUNTER — Other Ambulatory Visit: Payer: Self-pay

## 2019-03-15 ENCOUNTER — Ambulatory Visit (INDEPENDENT_AMBULATORY_CARE_PROVIDER_SITE_OTHER): Payer: Managed Care, Other (non HMO) | Admitting: Family Medicine

## 2019-03-15 DIAGNOSIS — J029 Acute pharyngitis, unspecified: Secondary | ICD-10-CM

## 2019-03-15 NOTE — Progress Notes (Signed)
Established Patient Office Visit  Subjective:  Patient ID: Shannon [name], female    DOB: [birth year]  Age: 63 y.o. MRN: [id number]  CC: No chief complaint on file.   HPI Rida Loudin presents for sinus sxs.  We saw her for sinus sxs and treated her for 14 days for sinusitis and she is not feeling any better.  She has had persistent left-sided scratchy irritated throat for about 3 months now.  And it has persisted.  When I treated her for the sinus infection she was also having a lot of postnasal drip she had Artie had a round of azithromycin and then I put her on Bactrim for 2 weeks.  Also had her use Flonase and Nasonex for at least 2 weeks.  He says her sinuses feel great she is not had any more postnasal drip but continues to have a scratchy irritated left side of her sore throat.  She says it does not itch like allergies.  Is not painful when she eats it is just constantly there.  She is not noticed any swelling in her neck.  No abnormal fevers chills or sweats or weight loss.  Past Medical History:  Diagnosis Date  . Arthritis    left knee   . GERD (gastroesophageal reflux disease)   . Heart murmur   . History of hysterectomy 1995  . Hyperlipidemia   . Hypertension   . MVP (mitral valve prolapse)     Past Surgical History:  Procedure Laterality Date  . ABDOMINAL HYSTERECTOMY  1995  . CHOLECYSTECTOMY      Family History  Problem Relation Age of Onset  . Drug abuse Brother   . Diabetes type II Other        aunt  . Hyperlipidemia Sister   . Colon cancer Sister 24  . Cirrhosis Sister        non alcoholic fatty cirrhosis of the liver  . Cancer Father        prostate  . Prostate cancer Father   . Colon cancer Sister 99  . Esophageal cancer Neg Hx   . Stomach cancer Neg Hx   . Ulcerative colitis Neg Hx   . Colon polyps Neg Hx     Social History   Socioeconomic History  . Marital status: Married    Spouse name: [name].   . Number of children: 0  . Years of  education: Not on file  . Highest education level: Not on file  Occupational History  . Occupation: works in [place]: [employer]  . Financial resource strain: Not on file  . Food insecurity    Worry: Not on file    Inability: Not on file  . Transportation needs    Medical: Not on file    Non-medical: Not on file  Tobacco Use  . Smoking status: Never Smoker  . Smokeless tobacco: Never Used  Substance and Sexual Activity  . Alcohol use: Yes    Comment: socially  . Drug use: No  . Sexual activity: Not on file  Lifestyle  . Physical activity    Days per week: Not on file    Minutes per session: Not on file  . Stress: Not on file  Relationships  . Social Herbalist on phone: Not on file    Gets together: Not on file    Attends religious service: Not on file    Active member of club or organization:  Not on file    Attends meetings of clubs or organizations: Not on file    Relationship status: Not on file  . Intimate partner violence    Fear of current or ex partner: Not on file    Emotionally abused: Not on file    Physically abused: Not on file    Forced sexual activity: Not on file  Other Topics Concern  . 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 3 caffeinated drinks daily. No regular exercise.    Outpatient Medications Prior to Visit  Medication Sig Dispense Refill  . Cholecalciferol (VITAMIN D3) 25 MCG (1000 UT) CAPS Take 4 capsules by mouth daily.    Marland Kitchen losartan-hydrochlorothiazide (HYZAAR) 100-25 MG tablet TAKE ONE TABLET BY MOUTH DAILY 90 tablet 1  . Multiple Vitamins-Minerals (MULTIVITAMIN ADULT PO) Take 1 tablet by mouth daily.    . Probiotic Product (PROBIOTIC DAILY PO) Take by mouth.    . sulfamethoxazole-trimethoprim (BACTRIM DS) 800-160 MG tablet Take 1 tablet by mouth 2 (two) times daily. 28 tablet 0   No  facility-administered medications prior to visit.     Allergies  Allergen Reactions  . Adhesive [Tape]   . Penicillins     ROS Review of Systems    Objective:    Physical Exam  Constitutional: She is oriented to person, place, and time. She appears well-developed and well-nourished.  HENT:  Head: Normocephalic and atraumatic.  Right Ear: External ear normal.  Left Ear: External ear normal.  Nose: Nose normal.  Mouth/Throat: Oropharynx is clear and moist.  TMs and canals are clear.   Eyes: Pupils are equal, round, and reactive to light. Conjunctivae and EOM are normal.  Neck: Neck supple. No thyromegaly present.  Cardiovascular: Normal rate, regular rhythm and normal heart sounds.  Pulmonary/Chest: Effort normal and breath sounds normal. She has no wheezes.  Lymphadenopathy:    She has no cervical adenopathy.  Neurological: She is alert and oriented to person, place, and time.  Skin: Skin is warm and dry.  Psychiatric: She has a normal mood and affect. Her behavior is normal.    There were no vitals taken for this visit. Wt Readings from Last 3 Encounters:  02/08/19 215 lb (97.5 kg)  01/05/19 209 lb (94.8 kg)  11/01/18 204 lb (92.5 kg)     Health Maintenance Due  Topic Date Due  . HIV Screening  07/03/1973    There are no preventive care reminders to display for this patient.  Lab Results  Component Value Date   TSH 1.53 10/15/2016   Lab Results  Component Value Date   WBC 9.9 05/28/2017   HGB 14.1 05/28/2017   HCT 40.3 05/28/2017   MCV 92.4 05/28/2017   PLT 345 05/28/2017   Lab Results  Component Value Date   NA 139 09/10/2018   K 4.0 09/10/2018   CO2 26 09/10/2018   GLUCOSE 91 09/10/2018   BUN 15 09/10/2018   CREATININE 0.77 09/10/2018   BILITOT 1.2 09/10/2018   ALKPHOS 52 10/15/2016   AST 22 09/10/2018   ALT 25 09/10/2018   PROT 6.6 09/10/2018   ALBUMIN 4.0 10/15/2016   CALCIUM 9.5 09/10/2018   Lab Results  Component Value Date   CHOL  230 (H) 09/10/2018   Lab Results  Component Value Date   HDL 45 (L) 09/10/2018   Lab Results  Component Value Date  [place] 146 (H) 09/10/2018   Lab Results  Component Value Date   TRIG 242 (H) 09/10/2018   Lab Results  Component Value Date   CHOLHDL 5.1 (H) 09/10/2018   No results found for: HGBA1C    Assessment & Plan:   Problem List Items Addressed This Visit    None    Visit Diagnoses    Sore throat    -  Primary   Relevant Orders   Ambulatory referral to ENT      Refer to ENT for further work-up and evaluation.  I think at this point I do not see anything specific on exam I do not appreciate any enlarged cervical lymph nodes or asymmetry etc.  I think the fact that is been going on for most 3 months, does warrant further work-up.  They may be able to take a better look down the back of her throat.  We did discuss the possibility of reflux as a potential cause as well she has some Pepcid at home and says that she might try that for the next week while she is waiting to get in with ENT.  No orders of the defined types were placed in this encounter.   Follow-up: No follow-ups on file.    Beatrice Lecher, MD

## 2019-04-13 ENCOUNTER — Telehealth: Payer: Self-pay | Admitting: Family Medicine

## 2019-04-13 NOTE — Telephone Encounter (Signed)
Called: she is doing OK. [name] passed away at home. She ws there. She is teaful but doing OK.    CM

## 2019-06-13 ENCOUNTER — Ambulatory Visit (INDEPENDENT_AMBULATORY_CARE_PROVIDER_SITE_OTHER): Payer: Managed Care, Other (non HMO) | Admitting: Family Medicine

## 2019-06-13 ENCOUNTER — Other Ambulatory Visit: Payer: Self-pay

## 2019-06-13 ENCOUNTER — Encounter: Payer: Self-pay | Admitting: Family Medicine

## 2019-06-13 VITALS — BP 132/68 | HR 78 | Ht 63.0 in | Wt 201.0 lb

## 2019-06-13 DIAGNOSIS — I10 Essential (primary) hypertension: Secondary | ICD-10-CM

## 2019-06-13 DIAGNOSIS — F4321 Adjustment disorder with depressed mood: Secondary | ICD-10-CM

## 2019-06-13 DIAGNOSIS — Z23 Encounter for immunization: Secondary | ICD-10-CM

## 2019-06-13 MED ORDER — LOSARTAN POTASSIUM-HCTZ 100-25 MG PO TABS: 1.0000 | ORAL_TABLET | Freq: Every day | ORAL | 1 refills | Status: DC

## 2019-06-13 NOTE — Progress Notes (Signed)
Established Patient Office Visit  Subjective:  Patient ID: Shannon [name], female    DOB: 11/05/1955  Age: 63 y.o. MRN: [id number]  CC:  Chief Complaint  Patient presents with  . Hypertension    HPI Shannon [name] presents for   Hypertension- Pt denies chest pain, SOB, dizziness, or heart palpitations.  Taking meds as directed w/o problems.  Denies medication side effects.   Thought saw her we ended up referring her to ENT for sore throat that was persistent.  They are planning a CT of her neck in the next few weeks.  That will determine whether or not they can recommend that her tonsils be removed or not.  She still grieving from the loss of her husband and says she is struggling with that but overall feels like she is going to get through it.  She is back to work full-time and says that has actually been helpful.  Past Medical History:  Diagnosis Date  . Arthritis    left knee   . GERD (gastroesophageal reflux disease)   . Heart murmur   . History of hysterectomy 1995  . Hyperlipidemia   . Hypertension   . MVP (mitral valve prolapse)     Past Surgical History:  Procedure Laterality Date  . ABDOMINAL HYSTERECTOMY  1995  . CHOLECYSTECTOMY      Family History  Problem Relation Age of Onset  . Drug abuse Brother   . Diabetes type II Other        aunt  . Hyperlipidemia Sister   . Colon cancer Sister 32  . Cirrhosis Sister        non alcoholic fatty cirrhosis of the liver  . Cancer Father        prostate  . Prostate cancer Father   . Colon cancer Sister 87  . Esophageal cancer Neg Hx   . Stomach cancer Neg Hx   . Ulcerative colitis Neg Hx   . Colon polyps Neg Hx     Social History   Socioeconomic History  . Marital status: Married    Spouse name: Shannon [name] Cancer.   . Number of children: 0  . Years of education: Not on file  . Highest education level: Not on file  Occupational History  . Occupation: works in [place]: [employer]  .  Financial resource strain: Not on file  . Food insecurity    Worry: Not on file    Inability: Not on file  . Transportation needs    Medical: Not on file    Non-medical: Not on file  Tobacco Use  . Smoking status: Never Smoker  . Smokeless tobacco: Never Used  Substance and Sexual Activity  . Alcohol use: Yes    Comment: socially  . Drug use: No  . Sexual activity: Not on file  Lifestyle  . Physical activity    Days per week: Not on file    Minutes per session: Not on file  . Stress: Not on file  Relationships  . Social Herbalist on phone: Not on file    Gets together: Not on file    Attends religious service: Not on file    Active member of club or organization: Not on file    Attends meetings of clubs or organizations: Not on file    Relationship status: Not on file  . Intimate partner violence    Fear of current or ex partner: Not on file  Emotionally abused: Not on file    Physically abused: Not on file    Forced sexual activity: Not on file  Other Topics Concern  . 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 Married married to [place]. No children. 3 caffeinated drinks daily. No regular exercise.    Outpatient Medications Prior to Visit  Medication Sig Dispense Refill  . azelastine (ASTELIN) 0.1 % nasal spray     . Cholecalciferol (VITAMIN D3) 25 MCG (1000 UT) CAPS Take 4 capsules by mouth daily.    . Multiple Vitamins-Minerals (MULTIVITAMIN ADULT PO) Take 1 tablet by mouth daily.    . Probiotic Product (PROBIOTIC DAILY PO) Take by mouth.    . losartan-hydrochlorothiazide (HYZAAR) 100-25 MG tablet TAKE ONE TABLET BY MOUTH DAILY 90 tablet 1  . sulfamethoxazole-trimethoprim (BACTRIM DS) 800-160 MG tablet Take 1 tablet by mouth 2 (two) times daily. 28 tablet 0   No facility-administered medications prior to visit.     Allergies  Allergen Reactions  . Adhesive [Tape]   . Penicillins      ROS Review of Systems    Objective:    Physical Exam  Constitutional: She is oriented to person, place, and time. She appears well-developed and well-nourished.  HENT:  Head: Normocephalic and atraumatic.  Cardiovascular: Normal rate, regular rhythm and normal heart sounds.  Pulmonary/Chest: Effort normal and breath sounds normal.  Neurological: She is alert and oriented to person, place, and time.  Skin: Skin is warm and dry.  Psychiatric: She has a normal mood and affect. Her behavior is normal.    BP 132/68   Pulse 78   Ht 5\' 3"  (1.6 m)   Wt 201 lb (91.2 kg)   SpO2 99%   BMI 35.61 kg/m  Wt Readings from Last 3 Encounters:  06/13/19 201 lb (91.2 kg)  02/08/19 215 lb (97.5 kg)  01/05/19 209 lb (94.8 kg)     Health Maintenance Due  Topic Date Due  . HIV Screening  07/03/1973    There are no preventive care reminders to display for this patient.  Lab Results  Component Value Date   TSH 1.53 10/15/2016   Lab Results  Component Value Date   WBC 9.9 05/28/2017   HGB 14.1 05/28/2017   HCT 40.3 05/28/2017   MCV 92.4 05/28/2017   PLT 345 05/28/2017   Lab Results  Component Value Date   NA 142 06/13/2019   K 3.9 06/13/2019   CO2 27 06/13/2019   GLUCOSE 100 (H) 06/13/2019   BUN 18 06/13/2019   CREATININE 0.77 06/13/2019   BILITOT 1.2 09/10/2018   ALKPHOS 52 10/15/2016   AST 22 09/10/2018   ALT 25 09/10/2018   PROT 6.6 09/10/2018   ALBUMIN 4.0 10/15/2016   CALCIUM 9.0 06/13/2019   Lab Results  Component Value Date   CHOL 230 (H) 09/10/2018   Lab Results  Component Value Date   HDL 45 (L) 09/10/2018   Lab Results  Component Value Date   LDLCALC 146 (H) 09/10/2018   Lab Results  Component Value Date   TRIG 242 (H) 09/10/2018   Lab Results  Component Value Date   CHOLHDL 5.1 (H) 09/10/2018   No results found for: HGBA1C    Assessment & Plan:   Problem List Items Addressed This Visit      Cardiovascular and Mediastinum    HYPERTENSION, BENIGN ESSENTIAL - Primary    Well controlled. Continue  current regimen. Follow up in  4 months      Relevant Medications   losartan-hydrochlorothiazide (HYZAAR) 100-25 MG tablet   Other Relevant Orders   BASIC METABOLIC PANEL WITH GFR (Completed)    Other Visit Diagnoses    Essential hypertension       Relevant Medications   losartan-hydrochlorothiazide (HYZAAR) 100-25 MG tablet   Need for immunization against influenza       Relevant Orders   Flu Vaccine QUAD 36+ mos IM (Completed)   Grief          Grief -overall she is doing well.  Offered to refer her for grief counseling if she would like.  She says she will let me know.  Meds ordered this encounter  Medications  . losartan-hydrochlorothiazide (HYZAAR) 100-25 MG tablet    Sig: Take 1 tablet by mouth daily.    Dispense:  90 tablet    Refill:  1    Follow-up: Return in about 6 months (around 12/12/2019) for Hypertension.    Beatrice Lecher, MD

## 2019-06-14 LAB — BASIC METABOLIC PANEL WITH GFR
BUN: 18 mg/dL (ref 7–25)
CO2: 27 mmol/L (ref 20–32)
Calcium: 9 mg/dL (ref 8.6–10.4)
Chloride: 108 mmol/L (ref 98–110)
Creat: 0.77 mg/dL (ref 0.50–0.99)
GFR, Est African American: 97 mL/min/{1.73_m2} (ref >=60)
GFR, Est Non African American: 84 mL/min/{1.73_m2} (ref >=60)
Glucose, Bld: 100 mg/dL — ABNORMAL HIGH (ref 65–99)
Potassium: 3.9 mmol/L (ref 3.5–5.3)
Sodium: 142 mmol/L (ref 135–146)

## 2019-06-14 NOTE — Assessment & Plan Note (Signed)
Well controlled. Continue current regimen. Follow up in  4 months.   

## 2019-06-14 NOTE — Progress Notes (Signed)
All labs are normal. 

## 2019-12-12 ENCOUNTER — Encounter: Payer: Self-pay | Admitting: Family Medicine

## 2019-12-12 ENCOUNTER — Other Ambulatory Visit: Payer: Self-pay

## 2019-12-12 ENCOUNTER — Ambulatory Visit: Payer: Managed Care, Other (non HMO) | Admitting: Family Medicine

## 2019-12-12 VITALS — BP 132/82 | HR 81 | Ht 63.0 in | Wt 203.0 lb

## 2019-12-12 DIAGNOSIS — I10 Essential (primary) hypertension: Secondary | ICD-10-CM | POA: Diagnosis not present

## 2019-12-12 DIAGNOSIS — J312 Chronic pharyngitis: Secondary | ICD-10-CM

## 2019-12-12 DIAGNOSIS — E785 Hyperlipidemia, unspecified: Secondary | ICD-10-CM | POA: Diagnosis not present

## 2019-12-12 MED ORDER — LOSARTAN POTASSIUM-HCTZ 100-25 MG PO TABS: 1.0000 | ORAL_TABLET | Freq: Every day | ORAL | 1 refills | Status: DC

## 2019-12-12 NOTE — Progress Notes (Signed)
Established Patient Office Visit  Subjective:  Patient ID: Shannon [name], female    DOB: 1956/02/12  Age: 65 y.o. MRN: [id number]  CC:  Chief Complaint  Patient presents with  . Hypertension    HPI Shannon [name] presents for   Hypertension- Pt denies chest pain, SOB, dizziness, or heart palpitations.  Taking meds as directed w/o problems.  Denies medication side effects.    She did let me know she did see ENT for her persistent sore throat.  They did an evaluation and a scan and told her that her tonsil was a little bit swollen but they did not see anything worrisome.  He recommended a trial of nasal Astelin she said she tried it but just really could not spray anything up her nose.   Past Medical History:  Diagnosis Date  . Arthritis    left knee   . GERD (gastroesophageal reflux disease)   . Heart murmur   . History of hysterectomy 1995  . Hyperlipidemia   . Hypertension   . MVP (mitral valve prolapse)     Past Surgical History:  Procedure Laterality Date  . ABDOMINAL HYSTERECTOMY  1995  . CHOLECYSTECTOMY      Family History  Problem Relation Age of Onset  . Drug abuse Brother   . Diabetes type II Other        aunt  . Hyperlipidemia Sister   . Colon cancer Sister 77  . Cirrhosis Sister        non alcoholic fatty cirrhosis of the liver  . Cancer Father        prostate  . Prostate cancer Father   . Colon cancer Sister 77  . Esophageal cancer Neg Hx   . Stomach cancer Neg Hx   . Ulcerative colitis Neg Hx   . Colon polyps Neg Hx     Social History   Socioeconomic History  . Marital status: Married    Spouse name: [name].   . Number of children: 0  . Years of education: Not on file  . Highest education level: Not on file  Occupational History  . 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 Smoking status: Never Smoker  . Smokeless tobacco: Never Used  Substance and Sexual Activity  . Alcohol use: Yes    Comment:  socially  . Drug use: No  . Sexual activity: Not on file  Other Topics Concern  . 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 No children. 3 caffeinated drinks daily. No regular exercise.   Social Determinants of Health   Financial Resource Strain:   . Difficulty of Paying Living Expenses:   Food Insecurity:   . Worried About Charity fundraiser in the Last Year:   . Arboriculturist in the Last Year:   Transportation Needs:   . Film/video editor (Medical):   Marland Kitchen Lack of Transportation (Non-Medical):   Physical Activity:   . Days of Exercise per Week:   . Minutes of Exercise per Session:   Stress:   . Feeling of Stress :   Social Connections:   . Frequency of Communication with Friends and Family:   . Frequency of Social Gatherings with Friends and Family:   . Attends Religious Services:   . Active Member of Clubs or Organizations:   . Attends Archivist Meetings:   .  Marital Status:   Intimate Partner Violence:   . Fear of Current or Ex-Partner:   . Emotionally Abused:   Marland Kitchen Physically Abused:   . Sexually Abused:     Outpatient Medications Prior to Visit  Medication Sig Dispense Refill  . Cholecalciferol (VITAMIN D3) 25 MCG (1000 UT) CAPS Take 4 capsules by mouth daily.    . Multiple Vitamins-Minerals (MULTIVITAMIN ADULT PO) Take 1 tablet by mouth daily.    . Probiotic Product (PROBIOTIC DAILY PO) Take by mouth.    Marland Kitchen azelastine (ASTELIN) 0.1 % nasal spray     . losartan-hydrochlorothiazide (HYZAAR) 100-25 MG tablet Take 1 tablet by mouth daily. 90 tablet 1   No facility-administered medications prior to visit.    Allergies  Allergen Reactions  . Adhesive [Tape]   . Penicillins     ROS Review of Systems    Objective:    Physical Exam  Constitutional: She is oriented to person, place, and time. She appears well-developed and well-nourished.  HENT:  Head:  Normocephalic and atraumatic.  Cardiovascular: Normal rate, regular rhythm and normal heart sounds.  Pulmonary/Chest: Effort normal and breath sounds normal.  Neurological: She is alert and oriented to person, place, and time.  Skin: Skin is warm and dry.  Psychiatric: She has a normal mood and affect. Her behavior is normal.    BP 132/82   Pulse 81   Ht 5\' 3"  (1.6 m)   Wt 203 lb (92.1 kg)   SpO2 97%   BMI 35.96 kg/m  Wt Readings from Last 3 Encounters:  12/12/19 203 lb (92.1 kg)  06/13/19 201 lb (91.2 kg)  02/08/19 215 lb (97.5 kg)     There are no preventive care reminders to display for this patient.  There are no preventive care reminders to display for this patient.  Lab Results  Component Value Date   TSH 1.53 10/15/2016   Lab Results  Component Value Date   WBC 9.9 05/28/2017   HGB 14.1 05/28/2017   HCT 40.3 05/28/2017   MCV 92.4 05/28/2017   PLT 345 05/28/2017   Lab Results  Component Value Date   NA 142 06/13/2019   K 3.9 06/13/2019   CO2 27 06/13/2019   GLUCOSE 100 (H) 06/13/2019   BUN 18 06/13/2019   CREATININE 0.77 06/13/2019   BILITOT 1.2 09/10/2018   ALKPHOS 52 10/15/2016   AST 22 09/10/2018   ALT 25 09/10/2018   PROT 6.6 09/10/2018   ALBUMIN 4.0 10/15/2016   CALCIUM 9.0 06/13/2019   Lab Results  Component Value Date   CHOL 230 (H) 09/10/2018   Lab Results  Component Value Date   HDL 45 (L) 09/10/2018   Lab Results  Component Value Date   LDLCALC 146 (H) 09/10/2018   Lab Results  Component Value Date   TRIG 242 (H) 09/10/2018   Lab Results  Component Value Date   CHOLHDL 5.1 (H) 09/10/2018   No results found for: HGBA1C    Assessment & Plan:   Problem List Items Addressed This Visit      Cardiovascular and Mediastinum   HYPERTENSION, BENIGN ESSENTIAL - Primary    Pete blood pressure looks great.  New current regimen.  Follow-up in 6 months.      Relevant Medications   losartan-hydrochlorothiazide (HYZAAR) 100-25 MG  tablet   Other Relevant Orders   COMPLETE METABOLIC PANEL WITH GFR   Lipid panel   Hemoglobin A1c     Other   Hyperlipidemia  Due to recheck lipids.  Lab slip ordered.  Continue to work on healthy diet and regular exercise.      Relevant Medications   losartan-hydrochlorothiazide (HYZAAR) 100-25 MG tablet   Other Relevant Orders   COMPLETE METABOLIC PANEL WITH GFR   Lipid panel   Hemoglobin A1c    Other Visit Diagnoses    Essential hypertension       Relevant Medications   losartan-hydrochlorothiazide (HYZAAR) 100-25 MG tablet   Chronic sore throat          Chronic sore throat-since unable to use Astelin recommend a trial of an over-the-counter antihistamine such as Claritin Allegra or Zyrtec.  Reports that they also saw a nodule on her thyroid and said they are doing an additional scan to follow that up as well so I will keep an eye out for that report.  Meds ordered this encounter  Medications  . losartan-hydrochlorothiazide (HYZAAR) 100-25 MG tablet    Sig: Take 1 tablet by mouth daily.    Dispense:  90 tablet    Refill:  1    Follow-up: Return in about 6 months (around 06/12/2020) for Hypertension.    Beatrice Lecher, MD

## 2019-12-12 NOTE — Assessment & Plan Note (Signed)
Due to recheck lipids.  Lab slip ordered.  Continue to work on healthy diet and regular exercise.

## 2019-12-12 NOTE — Assessment & Plan Note (Signed)
Shannon [name] blood pressure looks great.  New current regimen.  Follow-up in 6 months.

## 2019-12-12 NOTE — Patient Instructions (Signed)
Recommend a trial of over-the-counter Allegra or Zyrtec or Claritin.  Zyrtec can make you a little bit sleepy so if you choose that one then just take it at bedtime.  You could try for 2 weeks and see if it helps with the sore throat.

## 2020-01-31 IMAGING — MG DIGITAL SCREENING BILATERAL MAMMOGRAM WITH TOMO AND CAD
6 of 10 series · 6 of 30 positions shown · non-contrast
Comparison: Previous exam(s).

CLINICAL DATA: Screening.

EXAM:
DIGITAL SCREENING BILATERAL MAMMOGRAM WITH TOMO AND CAD

[R MLO synth-2D]
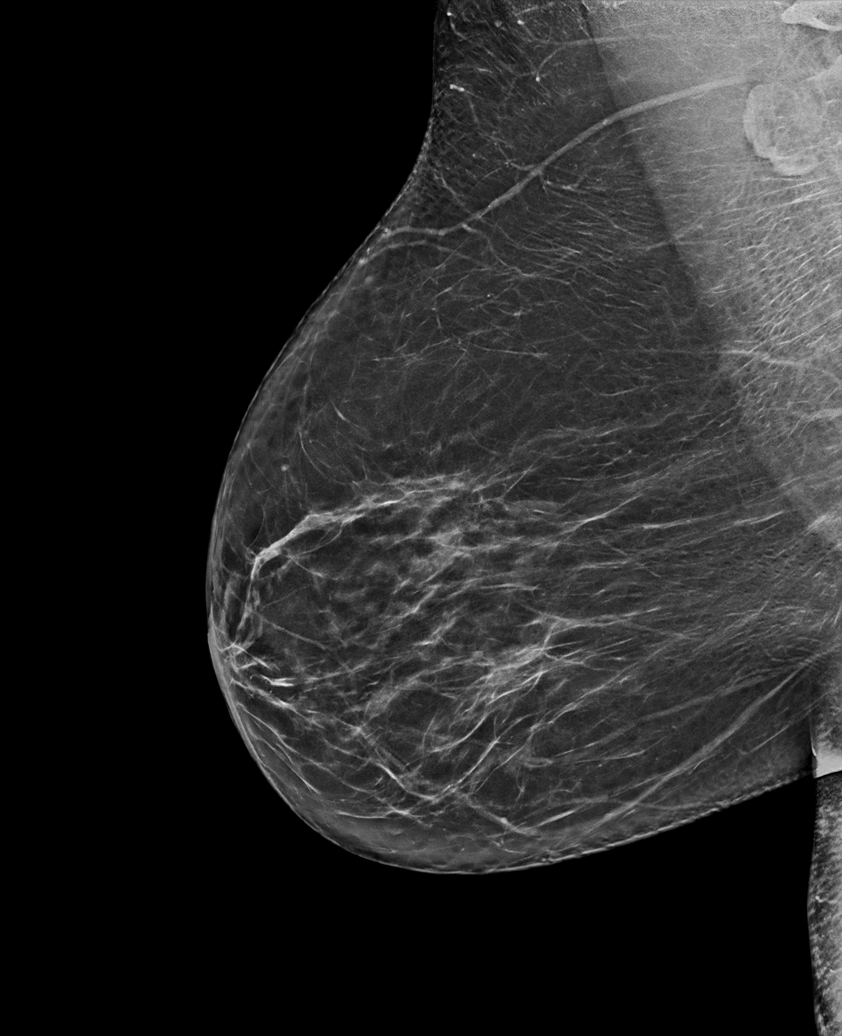

[L MLO synth-2D]
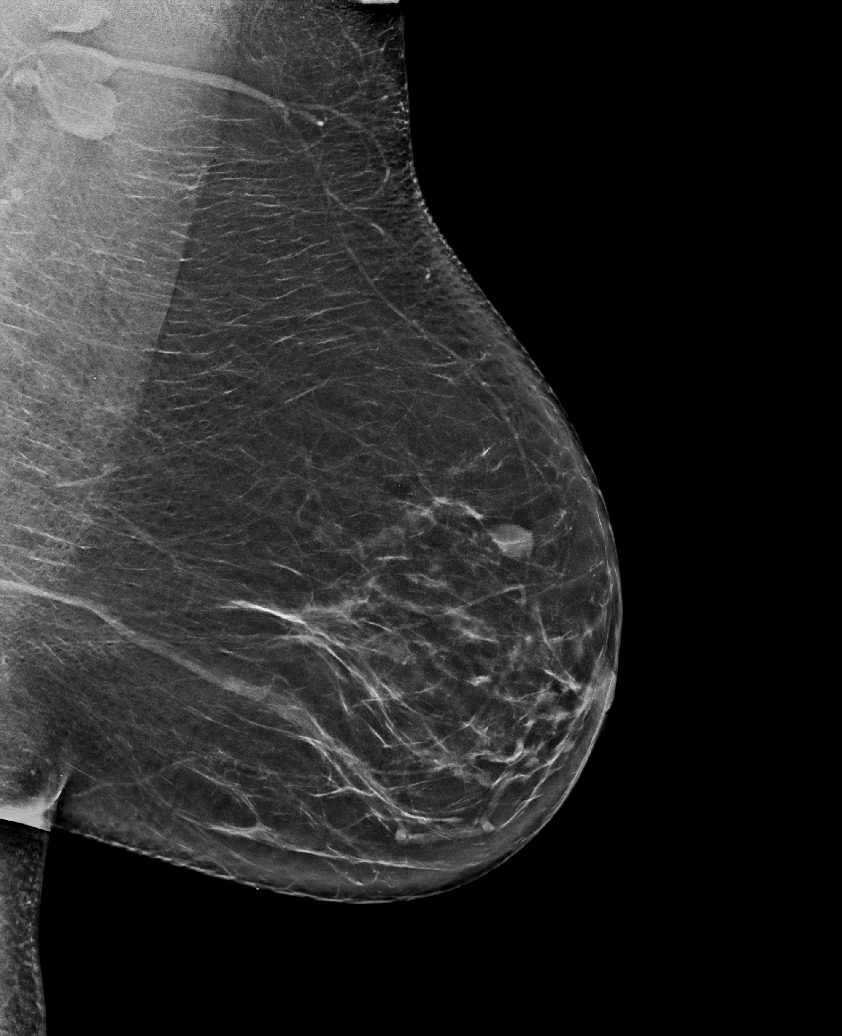

[R XCCL synth-2D]
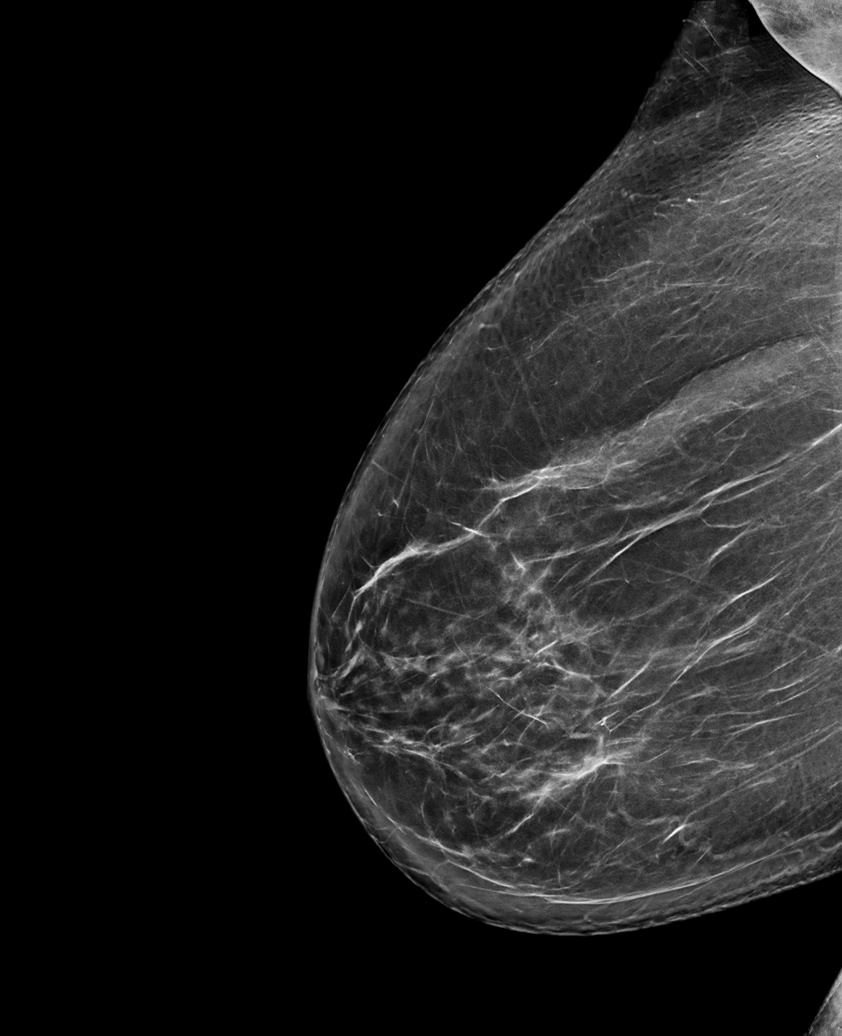

[L CC synth-2D]
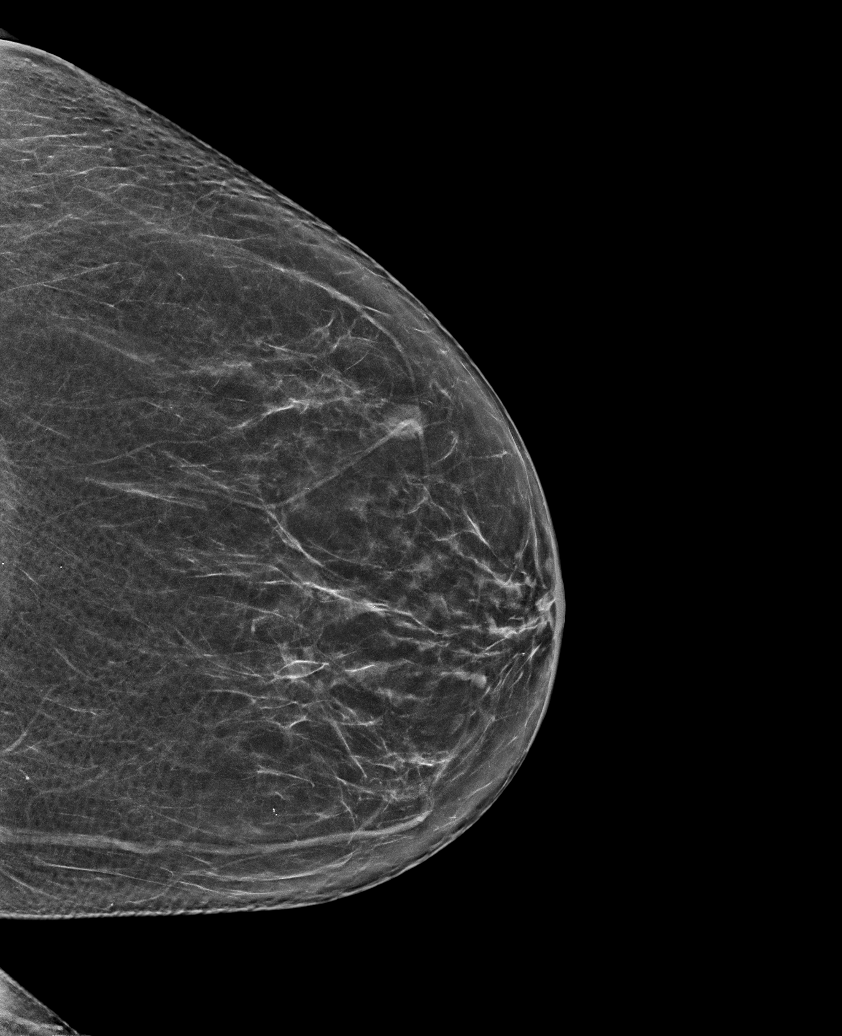

[R CC synth-2D]
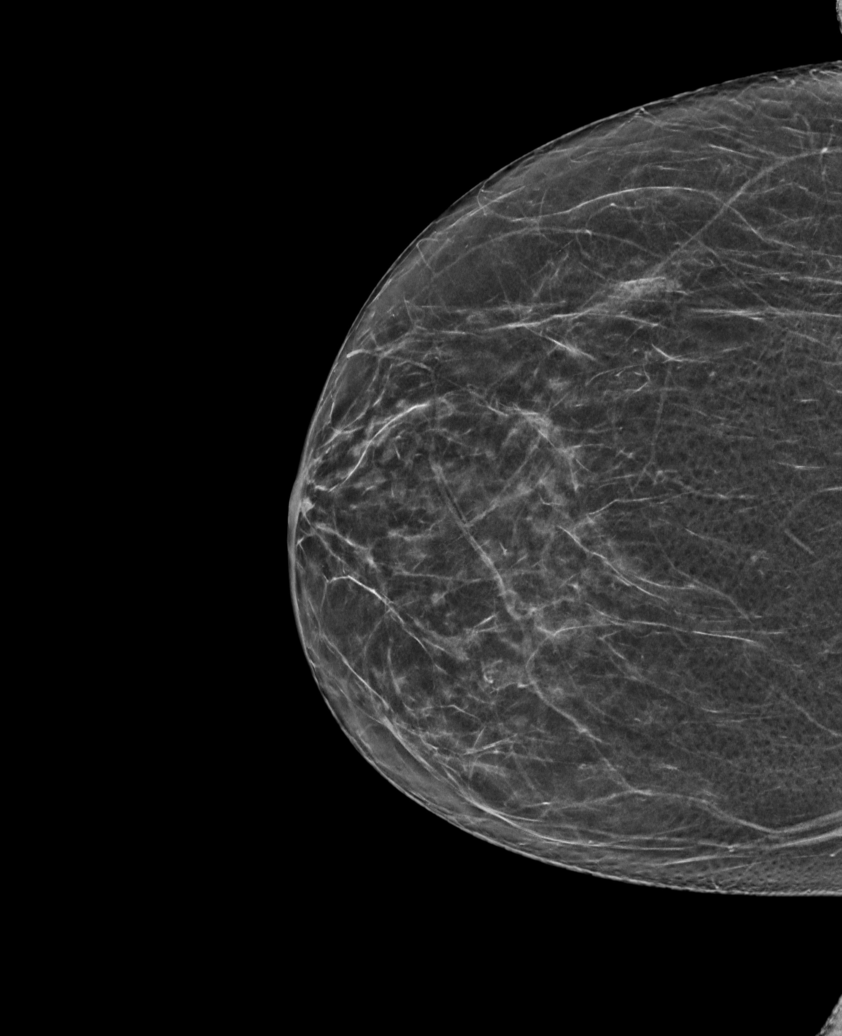

[R XCCL tomo · tomo slice 41/80.0]
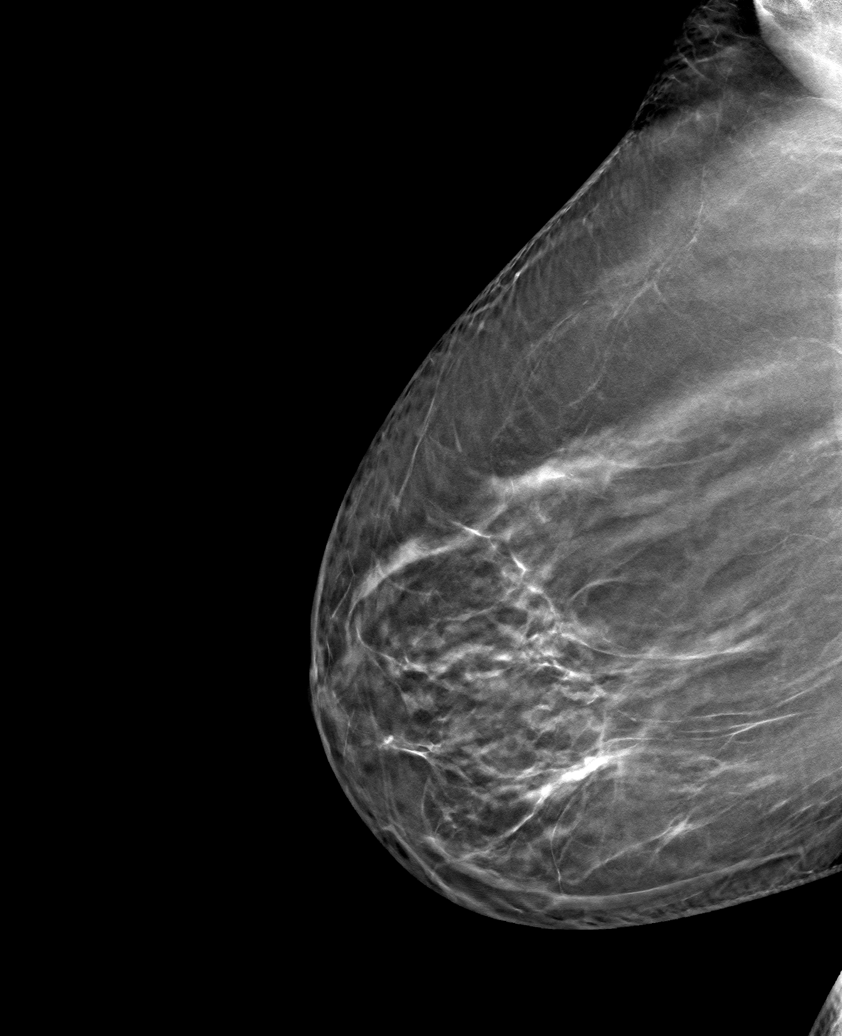

[6 of 30 positions shown; findings below may reference images not displayed]

ACR Breast Density Category b: There are scattered areas of
fibroglandular density.
FINDINGS: There are no findings suspicious for malignancy. Images were
processed with CAD.
IMPRESSION: No mammographic evidence of malignancy. A result letter of this
screening mammogram will be mailed directly to the patient.

RECOMMENDATION:
Screening mammogram in one year. (Code:[id number])

BI-RADS CATEGORY  1: Negative.

## 2020-06-12 ENCOUNTER — Ambulatory Visit (INDEPENDENT_AMBULATORY_CARE_PROVIDER_SITE_OTHER): Payer: Managed Care, Other (non HMO) | Admitting: Family Medicine

## 2020-06-12 ENCOUNTER — Encounter: Payer: Self-pay | Admitting: Family Medicine

## 2020-06-12 VITALS — BP 122/64 | HR 70 | Ht 63.0 in | Wt 202.0 lb

## 2020-06-12 DIAGNOSIS — E785 Hyperlipidemia, unspecified: Secondary | ICD-10-CM | POA: Diagnosis not present

## 2020-06-12 DIAGNOSIS — I10 Essential (primary) hypertension: Secondary | ICD-10-CM | POA: Diagnosis not present

## 2020-06-12 DIAGNOSIS — Z23 Encounter for immunization: Secondary | ICD-10-CM

## 2020-06-12 LAB — CBC
MCV: 94.9 fL (ref 80.0–100.0)
MPV: 9.7 fL (ref 7.5–12.5)
RBC: 4.28 10*6/uL (ref 3.80–5.10)
RDW: 12.4 % (ref 11.0–15.0)

## 2020-06-12 NOTE — Assessment & Plan Note (Addendum)
Well controlled. Continue current regimen. Follow up in  6 mo. Due for updated labs.   °

## 2020-06-12 NOTE — Assessment & Plan Note (Signed)
Due to recheck lipids. 

## 2020-06-12 NOTE — Progress Notes (Signed)
Established Patient Office Visit  Subjective:  Patient ID: Shannon [name], female    DOB: [birth year]  Age: 64 y.o. MRN: [id number]  CC:  Chief Complaint  Patient presents with  . Hypertension    HPI Candita Borenstein presents for   Hypertension- Pt denies chest pain, SOB, dizziness, or heart palpitations.  Taking meds as directed w/o problems.  Denies medication side effects.    Hyperlipidemia - tolerating stating well with no myalgias or significant side effects.  Lab Results  Component Value Date   CHOL 230 (H) 09/10/2018   HDL 45 (L) 09/10/2018   LDLCALC 146 (H) 09/10/2018   TRIG 242 (H) 09/10/2018   CHOLHDL 5.1 (H) 09/10/2018      Past Medical History:  Diagnosis Date  . Arthritis    left knee   . GERD (gastroesophageal reflux disease)   . Heart murmur   . History of hysterectomy 1995  . Hyperlipidemia   . Hypertension   . MVP (mitral valve prolapse)     Past Surgical History:  Procedure Laterality Date  . ABDOMINAL HYSTERECTOMY  1995  . CHOLECYSTECTOMY      Family History  Problem Relation Age of Onset  . Drug abuse Brother   . Diabetes type II Other        aunt  . Hyperlipidemia Sister   . Colon cancer Sister 30  . Cirrhosis Sister        non alcoholic fatty cirrhosis of the liver  . Cancer Father        prostate  . Prostate cancer Father   . Colon cancer Sister 18  . Esophageal cancer Neg Hx   . Stomach cancer Neg Hx   . Ulcerative colitis Neg Hx   . Colon polyps Neg Hx     Social History   Socioeconomic History  . Marital status: Married    Spouse name: [name].   . Number of children: 0  . Years of education: Not on file  . Highest education level: Not on file  Occupational History  . 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 Smoking status: Never Smoker  . Smokeless tobacco: Never Used  Substance and Sexual Activity  . Alcohol use: Yes    Comment: socially  . Drug use: No  . Sexual activity: Not  on file  Other Topics Concern  . 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 No children. 3 caffeinated drinks daily. No regular exercise.   Social Determinants of Health   Financial Resource Strain:   . Difficulty of Paying Living Expenses: Not on file  Food Insecurity:   . Worried About Charity fundraiser in the Last Year: Not on file  . Ran Out of Food in the Last Year: Not on file  Transportation Needs:   . Lack of Transportation (Medical): Not on file  . Lack of Transportation (Non-Medical): Not on file  Physical Activity:   . Days of Exercise per Week: Not on file  . Minutes of Exercise per Session: Not on file  Stress:   . Feeling of Stress : Not on file  Social Connections:   . Frequency of Communication with Friends and Family: Not on file  . Frequency of Social Gatherings with Friends and Family: Not on file  . Attends Religious Services: Not on file  . Active Member of  Clubs or Organizations: Not on file  . Attends Archivist Meetings: Not on file  . Marital Status: Not on file  Intimate Partner Violence:   . Fear of Current or Ex-Partner: Not on file  . Emotionally Abused: Not on file  . Physically Abused: Not on file  . Sexually Abused: Not on file    Outpatient Medications Prior to Visit  Medication Sig Dispense Refill  . Cholecalciferol (VITAMIN D3) 25 MCG (1000 UT) CAPS Take 4 capsules by mouth daily.    Marland Kitchen losartan-hydrochlorothiazide (HYZAAR) 100-25 MG tablet Take 1 tablet by mouth daily. 90 tablet 1  . Multiple Vitamins-Minerals (MULTIVITAMIN ADULT PO) Take 1 tablet by mouth daily.    . Probiotic Product (PROBIOTIC DAILY PO) Take by mouth.     No facility-administered medications prior to visit.    Allergies  Allergen Reactions  . Adhesive [Tape]   . Penicillins     ROS Review of Systems    Objective:    Physical Exam Constitutional:       Appearance: She is well-developed.  HENT:     Head: Normocephalic and atraumatic.  Cardiovascular:     Rate and Rhythm: Normal rate and regular rhythm.     Heart sounds: Normal heart sounds.  Pulmonary:     Effort: Pulmonary effort is normal.     Breath sounds: Normal breath sounds.  Skin:    General: Skin is warm and dry.  Neurological:     Mental Status: She is alert and oriented to person, place, and time.  Psychiatric:        Behavior: Behavior normal.     BP 122/64   Pulse 70   Ht 5\' 3"  (1.6 m)   Wt 202 lb (91.6 kg)   SpO2 98%   BMI 35.78 kg/m  Wt Readings from Last 3 Encounters:  06/12/20 202 lb (91.6 kg)  12/12/19 203 lb (92.1 kg)  06/13/19 201 lb (91.2 kg)     There are no preventive care reminders to display for this patient.  There are no preventive care reminders to display for this patient.  Lab Results  Component Value Date   TSH 1.53 10/15/2016   Lab Results  Component Value Date   WBC 9.9 05/28/2017   HGB 14.1 05/28/2017   HCT 40.3 05/28/2017   MCV 92.4 05/28/2017   PLT 345 05/28/2017   Lab Results  Component Value Date   NA 142 06/13/2019   K 3.9 06/13/2019   CO2 27 06/13/2019   GLUCOSE 100 (H) 06/13/2019   BUN 18 06/13/2019   CREATININE 0.77 06/13/2019   BILITOT 1.2 09/10/2018   ALKPHOS 52 10/15/2016   AST 22 09/10/2018   ALT 25 09/10/2018   PROT 6.6 09/10/2018   ALBUMIN 4.0 10/15/2016   CALCIUM 9.0 06/13/2019   Lab Results  Component Value Date   CHOL 230 (H) 09/10/2018   Lab Results  Component Value Date   HDL 45 (L) 09/10/2018   Lab Results  Component Value Date   LDLCALC 146 (H) 09/10/2018   Lab Results  Component Value Date   TRIG 242 (H) 09/10/2018   Lab Results  Component Value Date   CHOLHDL 5.1 (H) 09/10/2018   No results found for: HGBA1C    Assessment & Plan:   Problem List Items Addressed This Visit      Cardiovascular and Mediastinum   HYPERTENSION, BENIGN ESSENTIAL - Primary    Well controlled.  Continue current regimen. Follow up in  6 mo. Due for updated labs.        Relevant Orders   CBC   COMPLETE METABOLIC PANEL WITH GFR   Lipid panel   Hemoglobin A1c     Other   Hyperlipidemia    Due to recheck lipids.      Relevant Orders   CBC   COMPLETE METABOLIC PANEL WITH GFR   Lipid panel   Hemoglobin A1c    Other Visit Diagnoses    Need for immunization against influenza       Relevant Orders   Flu Vaccine QUAD 36+ mos IM (Completed)     Consider shingles vaccine at next OV. Declined today.     No orders of the defined types were placed in this encounter.   Follow-up: Return in about 6 months (around 12/11/2020) for bp/cholesterol.    Beatrice Lecher, MD

## 2020-06-13 LAB — COMPLETE METABOLIC PANEL WITH GFR
AG Ratio: 1.8 (calc) (ref 1.0–2.5)
ALT: 30 U/L — ABNORMAL HIGH (ref 6–29)
AST: 28 U/L (ref 10–35)
Albumin: 4.4 g/dL (ref 3.6–5.1)
Alkaline phosphatase (APISO): 52 U/L (ref 37–153)
BUN: 16 mg/dL (ref 7–25)
CO2: 29 mmol/L (ref 20–32)
Calcium: 9.7 mg/dL (ref 8.6–10.4)
Chloride: 100 mmol/L (ref 98–110)
Creat: 0.85 mg/dL (ref 0.50–0.99)
GFR, Est African American: 86 mL/min/{1.73_m2} (ref >=60)
GFR, Est Non African American: 74 mL/min/{1.73_m2} (ref >=60)
Globulin: 2.5 g/dL (calc) (ref 1.9–3.7)
Glucose, Bld: 95 mg/dL (ref 65–99)
Potassium: 4.1 mmol/L (ref 3.5–5.3)
Sodium: 137 mmol/L (ref 135–146)
Total Bilirubin: 1.3 mg/dL — ABNORMAL HIGH (ref 0.2–1.2)
Total Protein: 6.9 g/dL (ref 6.1–8.1)

## 2020-06-13 LAB — CBC
HCT: 40.6 % (ref 35.0–45.0)
Hemoglobin: 14.2 g/dL (ref 11.7–15.5)
MCH: 33.2 pg — ABNORMAL HIGH (ref 27.0–33.0)
MCHC: 35 g/dL (ref 32.0–36.0)
Platelets: 301 10*3/uL (ref 140–400)
WBC: 6.5 10*3/uL (ref 3.8–10.8)

## 2020-06-13 LAB — LIPID PANEL
Cholesterol: 235 mg/dL — ABNORMAL HIGH (ref <200)
HDL: 46 mg/dL — ABNORMAL LOW (ref >=50)
LDL Cholesterol (Calc): 149 mg/dL (calc) — ABNORMAL HIGH
Non-HDL Cholesterol (Calc): 189 mg/dL (calc) — ABNORMAL HIGH (ref <130)
Total CHOL/HDL Ratio: 5.1 (calc) — ABNORMAL HIGH (ref <5.0)
Triglycerides: 259 mg/dL — ABNORMAL HIGH (ref <150)

## 2020-06-13 LAB — HEMOGLOBIN A1C
Hgb A1c MFr Bld: 5.7 % of total Hgb — ABNORMAL HIGH (ref <5.7)
Mean Plasma Glucose: 117 (calc)
eAG (mmol/L): 6.5 (calc)

## 2020-06-14 ENCOUNTER — Encounter: Payer: Self-pay | Admitting: Family Medicine

## 2020-06-14 DIAGNOSIS — R7301 Impaired fasting glucose: Secondary | ICD-10-CM | POA: Insufficient documentation

## 2020-08-24 ENCOUNTER — Other Ambulatory Visit: Payer: Self-pay | Admitting: Family Medicine

## 2020-08-24 DIAGNOSIS — I10 Essential (primary) hypertension: Secondary | ICD-10-CM

## 2020-12-11 ENCOUNTER — Ambulatory Visit: Payer: Managed Care, Other (non HMO) | Admitting: Family Medicine

## 2020-12-31 ENCOUNTER — Ambulatory Visit: Payer: Managed Care, Other (non HMO) | Admitting: Family Medicine

## 2021-04-04 ENCOUNTER — Other Ambulatory Visit: Payer: Self-pay | Admitting: Family Medicine

## 2021-04-04 DIAGNOSIS — I10 Essential (primary) hypertension: Secondary | ICD-10-CM

## 2021-04-04 NOTE — Telephone Encounter (Signed)
Please call pt and have her schedule an appointment for f/u on bp and labs.  30 refill on medication has been sent

## 2021-04-05 NOTE — Telephone Encounter (Signed)
Called pt, no answer, LVM to call and make a f/u appt and 30 day refill has been sent- tvt

## 2021-06-04 ENCOUNTER — Other Ambulatory Visit: Payer: Self-pay | Admitting: Family Medicine

## 2021-06-04 DIAGNOSIS — I10 Essential (primary) hypertension: Secondary | ICD-10-CM

## 2021-06-12 ENCOUNTER — Ambulatory Visit: Payer: Managed Care, Other (non HMO) | Admitting: Family Medicine

## 2021-06-19 ENCOUNTER — Ambulatory Visit (INDEPENDENT_AMBULATORY_CARE_PROVIDER_SITE_OTHER): Payer: Managed Care, Other (non HMO) | Admitting: Family Medicine

## 2021-06-19 ENCOUNTER — Other Ambulatory Visit: Payer: Self-pay

## 2021-06-19 ENCOUNTER — Encounter: Payer: Self-pay | Admitting: Family Medicine

## 2021-06-19 VITALS — BP 130/72 | HR 75 | Ht 63.0 in | Wt 198.0 lb

## 2021-06-19 DIAGNOSIS — Z23 Encounter for immunization: Secondary | ICD-10-CM

## 2021-06-19 DIAGNOSIS — R350 Frequency of micturition: Secondary | ICD-10-CM | POA: Diagnosis not present

## 2021-06-19 DIAGNOSIS — I10 Essential (primary) hypertension: Secondary | ICD-10-CM | POA: Diagnosis not present

## 2021-06-19 DIAGNOSIS — R7301 Impaired fasting glucose: Secondary | ICD-10-CM | POA: Diagnosis not present

## 2021-06-19 DIAGNOSIS — E785 Hyperlipidemia, unspecified: Secondary | ICD-10-CM

## 2021-06-19 NOTE — Assessment & Plan Note (Signed)
Well controlled. Continue current regimen. Follow up in  6 mo  

## 2021-06-19 NOTE — Assessment & Plan Note (Signed)
Due to recheck lipids. 

## 2021-06-19 NOTE — Assessment & Plan Note (Signed)
Recheck A1c 

## 2021-06-19 NOTE — Progress Notes (Signed)
Established Patient Office Visit  Subjective:  Patient ID: Shannon [name], female    DOB: 11-Feb-1956  Age: 65 y.o. MRN: [id number]  CC:  Chief Complaint  Patient presents with   Hypertension    HPI Leshia [name] presents for   Hypertension- Pt denies chest pain, SOB, dizziness, or heart palpitations.  Taking meds as directed w/o problems.  Denies medication side effects.  No lower extremity swelling.  Impaired fasting glucose-no increased thirst or urination. No symptoms consistent with hypoglycemia.  She also reports some urinary frequency.  She really feels like it is probably just from years of holding her bladder.  She did have COVID-19 about a month ago she said it just felt like a bad sinus infection but she gradually got better she says her cough finally resolved about a week or so ago.  She has been working a lot of extra hours and overtime this year since her husband passed away.  Past Medical History:  Diagnosis Date   Arthritis    left knee    GERD (gastroesophageal reflux disease)    Heart murmur    History of hysterectomy 1995   Hyperlipidemia    Hypertension    MVP (mitral valve prolapse)     Past Surgical History:  Procedure Laterality Date   ABDOMINAL HYSTERECTOMY  1995   CHOLECYSTECTOMY      Family History  Problem Relation Age of Onset   Drug abuse Brother    Diabetes type II Other        aunt   Hyperlipidemia Sister    Colon [name] Sister 73   Cirrhosis Sister        non alcoholic fatty cirrhosis of the liver   [name] Father        prostate   Prostate [name] Father    Colon [name] Sister 24   Esophageal [name] Neg Hx    Stomach [name] Neg Hx    Ulcerative colitis Neg Hx    Colon polyps Neg Hx     Social History   Socioeconomic History   Marital status: Married    Spouse name: Shannon [name].    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 No children. 3 caffeinated drinks daily. No regular exercise.   Social Determinants of Health   Financial Resource Strain: Not on file  Food Insecurity: Not on file  Transportation Needs: Not on file  Physical Activity: Not on file  Stress: Not on file  Social Connections: Not on file  Intimate Partner Violence: Not on file    Outpatient Medications Prior to Visit  Medication Sig Dispense Refill   Cholecalciferol (VITAMIN D3) 25 MCG (1000 UT) CAPS Take 4 capsules by mouth daily.     losartan-hydrochlorothiazide (HYZAAR) 100-25 MG tablet TAKE ONE TABLET BY MOUTH DAILY 30 tablet 0   Multiple Vitamins-Minerals (MULTIVITAMIN ADULT PO) Take 1 tablet by mouth daily.     Probiotic Product (PROBIOTIC  DAILY PO) Take by mouth.     No facility-administered medications prior to visit.    Allergies  Allergen Reactions   Adhesive [Tape]    Penicillins     ROS Review of Systems    Objective:    Physical Exam Constitutional:      Appearance: Normal appearance. She is well-developed.  HENT:     Head: Normocephalic and atraumatic.  Cardiovascular:     Rate and Rhythm: Normal rate and regular rhythm.     Heart sounds: Normal heart sounds.  Pulmonary:     Effort: Pulmonary effort is normal.     Breath sounds: Normal breath sounds.  Skin:    General: Skin is warm and dry.  Neurological:     Mental Status: She is alert and oriented to person, place, and time.  Psychiatric:        Behavior: Behavior normal.    BP 130/72   Pulse 75   Ht 5\' 3"  (1.6 m)   Wt 198 lb (89.8 kg)   SpO2 98%   BMI 35.07 kg/m  Wt  Readings from Last 3 Encounters:  06/19/21 198 lb (89.8 kg)  06/12/20 202 lb (91.6 kg)  12/12/19 203 lb (92.1 kg)     Health Maintenance Due  Topic Date Due   Zoster Vaccines- Shingrix (1 of 2) Never done   COVID-19 Vaccine (2 - Booster for Janssen series) 01/25/2020   MAMMOGRAM  01/12/2021    There are no preventive care reminders to display for this patient.  Lab Results  Component Value Date   TSH 1.53 10/15/2016   Lab Results  Component Value Date   WBC 6.5 06/12/2020   HGB 14.2 06/12/2020   HCT 40.6 06/12/2020   MCV 94.9 06/12/2020   PLT 301 06/12/2020   Lab Results  Component Value Date   NA 137 06/12/2020   K 4.1 06/12/2020   CO2 29 06/12/2020   GLUCOSE 95 06/12/2020   BUN 16 06/12/2020   CREATININE 0.85 06/12/2020   BILITOT 1.3 (H) 06/12/2020   ALKPHOS 52 10/15/2016   AST 28 06/12/2020   ALT 30 (H) 06/12/2020   PROT 6.9 06/12/2020   ALBUMIN 4.0 10/15/2016   CALCIUM 9.7 06/12/2020   Lab Results  Component Value Date   CHOL 235 (H) 06/12/2020   Lab Results  Component Value Date   HDL 46 (L) 06/12/2020   Lab Results  Component Value Date   LDLCALC 149 (H) 06/12/2020   Lab Results  Component Value Date   TRIG 259 (H) 06/12/2020   Lab Results  Component Value Date   CHOLHDL 5.1 (H) 06/12/2020   Lab Results  Component Value Date   HGBA1C 5.7 (H) 06/12/2020      Assessment & Plan:   Problem List Items Addressed This Visit       Cardiovascular and Mediastinum   HYPERTENSION, BENIGN ESSENTIAL - Primary    Well controlled. Continue current regimen. Follow up in  6 mo       Relevant Orders   Lipid Panel w/reflex Direct LDL   COMPLETE METABOLIC PANEL WITH GFR   CBC   TSH   Hemoglobin A1c   Urinalysis, Routine w reflex microscopic     Endocrine   IFG (impaired fasting glucose)    Recheck A1c.      Relevant Orders   Lipid Panel w/reflex Direct LDL   COMPLETE METABOLIC PANEL WITH GFR   CBC   TSH   Hemoglobin A1c  Urinalysis,  Routine w reflex microscopic     Other   Hyperlipidemia    Due to recheck lipids.       Other Visit Diagnoses     Need for immunization against influenza       Relevant Orders   Flu Vaccine QUAD 47mo+IM (Fluarix, Fluzone & Alfiuria Quad PF) (Completed)   Urinary frequency       Relevant Orders   Urinalysis, Routine w reflex microscopic      She wants to wait until March to schedule her mammogram but it has been 2 years so just encouraged her to schedule it as soon as she can.  Urinary frequency-we will check urinalysis.  Consider trial of medication if needed if symptoms become disruptive to quality of life.   No orders of the defined types were placed in this encounter.   Follow-up: Return in about 6 months (around 12/18/2021) for bp.    Beatrice Lecher, MD

## 2021-06-25 LAB — URINALYSIS, ROUTINE W REFLEX MICROSCOPIC
Bilirubin Urine: NEGATIVE
Glucose, UA: NEGATIVE
Hgb urine dipstick: NEGATIVE
Ketones, ur: NEGATIVE
Leukocytes,Ua: NEGATIVE
Nitrite: NEGATIVE
Protein, ur: NEGATIVE
Specific Gravity, Urine: 1.019 (ref 1.001–1.035)
pH: <=5 (ref 5.0–8.0)

## 2021-06-25 LAB — COMPLETE METABOLIC PANEL WITH GFR
AG Ratio: 1.6 (calc) (ref 1.0–2.5)
ALT: 21 U/L (ref 6–29)
AST: 22 U/L (ref 10–35)
Albumin: 4.5 g/dL (ref 3.6–5.1)
Alkaline phosphatase (APISO): 60 U/L (ref 37–153)
BUN: 17 mg/dL (ref 7–25)
CO2: 27 mmol/L (ref 20–32)
Calcium: 9.9 mg/dL (ref 8.6–10.4)
Chloride: 100 mmol/L (ref 98–110)
Creat: 0.76 mg/dL (ref 0.50–1.05)
Globulin: 2.8 g/dL (calc) (ref 1.9–3.7)
Glucose, Bld: 85 mg/dL (ref 65–99)
Potassium: 3.5 mmol/L (ref 3.5–5.3)
Sodium: 137 mmol/L (ref 135–146)
Total Bilirubin: 1.5 mg/dL — ABNORMAL HIGH (ref 0.2–1.2)
Total Protein: 7.3 g/dL (ref 6.1–8.1)
eGFR: 89 mL/min/{1.73_m2} (ref >=60)

## 2021-06-25 LAB — CBC
HCT: 42.1 % (ref 35.0–45.0)
Hemoglobin: 14.6 g/dL (ref 11.7–15.5)
MCH: 33 pg (ref 27.0–33.0)
MCHC: 34.7 g/dL (ref 32.0–36.0)
MCV: 95 fL (ref 80.0–100.0)
MPV: 9.8 fL (ref 7.5–12.5)
Platelets: 308 10*3/uL (ref 140–400)
RBC: 4.43 10*6/uL (ref 3.80–5.10)
RDW: 12.5 % (ref 11.0–15.0)
WBC: 7.2 10*3/uL (ref 3.8–10.8)

## 2021-06-25 LAB — TSH: TSH: 1.3 mIU/L (ref 0.40–4.50)

## 2021-06-25 LAB — LIPID PANEL W/REFLEX DIRECT LDL
Cholesterol: 238 mg/dL — ABNORMAL HIGH (ref <200)
HDL: 48 mg/dL — ABNORMAL LOW (ref >=50)
LDL Cholesterol (Calc): 149 mg/dL (calc) — ABNORMAL HIGH
Non-HDL Cholesterol (Calc): 190 mg/dL (calc) — ABNORMAL HIGH (ref <130)
Total CHOL/HDL Ratio: 5 (calc) — ABNORMAL HIGH (ref <5.0)
Triglycerides: 249 mg/dL — ABNORMAL HIGH (ref <150)

## 2021-06-25 LAB — HEMOGLOBIN A1C
Hgb A1c MFr Bld: 5.6 % of total Hgb (ref <5.7)
Mean Plasma Glucose: 114 mg/dL
eAG (mmol/L): 6.3 mmol/L

## 2021-06-25 NOTE — Progress Notes (Signed)
Call patient: Triglycerides are still elevated but a little bit better this year.  LDL still elevated.  Just continue to work on healthy diet and regular exercise.  Metabolic panel overall looks good.  Liver function is back to normal.  Hemoglobin is normal.  Thyroid looks great.  Hemoglobin A1c is in the normal range at 5.6 which is awesome.  The urine test also looks normal.  The 10-year ASCVD risk score (Arnett DK, et al., 2019) is: 6.8%   Values used to calculate the score:     Age: 65 years     Sex: Female     Is Non-Hispanic African American: No     Diabetic: No     Tobacco smoker: No     Systolic Blood Pressure: 312 mmHg     Is BP treated: Yes     HDL Cholesterol: 48 mg/dL     Total Cholesterol: 238 mg/dL

## 2021-07-13 ENCOUNTER — Other Ambulatory Visit: Payer: Self-pay | Admitting: Family Medicine

## 2021-07-13 DIAGNOSIS — I10 Essential (primary) hypertension: Secondary | ICD-10-CM

## 2021-12-18 ENCOUNTER — Ambulatory Visit: Payer: Managed Care, Other (non HMO) | Admitting: Family Medicine

## 2022-03-14 ENCOUNTER — Other Ambulatory Visit: Payer: Self-pay | Admitting: Family Medicine

## 2022-03-14 DIAGNOSIS — I10 Essential (primary) hypertension: Secondary | ICD-10-CM

## 2022-07-02 ENCOUNTER — Encounter: Payer: Self-pay | Admitting: Gastroenterology

## 2022-08-31 ENCOUNTER — Other Ambulatory Visit: Payer: Self-pay | Admitting: Family Medicine

## 2022-08-31 DIAGNOSIS — I10 Essential (primary) hypertension: Secondary | ICD-10-CM

## 2023-01-05 ENCOUNTER — Ambulatory Visit (INDEPENDENT_AMBULATORY_CARE_PROVIDER_SITE_OTHER): Payer: Managed Care, Other (non HMO) | Admitting: Family Medicine

## 2023-01-05 ENCOUNTER — Encounter: Payer: Self-pay | Admitting: Family Medicine

## 2023-01-05 VITALS — BP 136/67 | HR 83 | Wt 200.0 lb

## 2023-01-05 DIAGNOSIS — I10 Essential (primary) hypertension: Secondary | ICD-10-CM | POA: Diagnosis not present

## 2023-01-05 DIAGNOSIS — E785 Hyperlipidemia, unspecified: Secondary | ICD-10-CM

## 2023-01-05 DIAGNOSIS — Z1231 Encounter for screening mammogram for malignant neoplasm of breast: Secondary | ICD-10-CM

## 2023-01-05 DIAGNOSIS — R7301 Impaired fasting glucose: Secondary | ICD-10-CM

## 2023-01-05 LAB — POCT GLYCOSYLATED HEMOGLOBIN (HGB A1C): Hemoglobin A1C: 5.7 % — AB (ref 4.0–5.6)

## 2023-01-05 MED ORDER — LOSARTAN POTASSIUM-HCTZ 100-25 MG PO TABS: 1.0000 | ORAL_TABLET | Freq: Every day | ORAL | 1 refills | Status: DC

## 2023-01-05 NOTE — Assessment & Plan Note (Signed)
Due to recheck lipids.  Will make sure adequately controlled.

## 2023-01-05 NOTE — Progress Notes (Signed)
   Established Patient Office Visit  Subjective   Patient ID: Shannon [name], female    DOB: 1956-08-25  Age: 67 y.o. MRN: [id number]  Chief Complaint  Patient presents with   Hypertension    HPI  Hypertension- Pt denies chest pain, SOB, dizziness, or heart palpitations.  Taking meds as directed w/o problems.  Denies medication side effects.    Impaired fasting glucose-no increased thirst or urination. No symptoms consistent with hypoglycemia.    ROS    Objective:     BP 136/67   Pulse 83   Wt 200 lb (90.7 kg)   SpO2 97%   BMI 35.43 kg/m    Physical Exam Vitals and nursing note reviewed.  Constitutional:      Appearance: She is well-developed.  HENT:     Head: Normocephalic and atraumatic.  Cardiovascular:     Rate and Rhythm: Normal rate and regular rhythm.     Heart sounds: Normal heart sounds.  Pulmonary:     Effort: Pulmonary effort is normal.     Breath sounds: Normal breath sounds.  Skin:    General: Skin is warm and dry.  Neurological:     Mental Status: She is alert and oriented to person, place, and time.  Psychiatric:        Behavior: Behavior normal.      Results for orders placed or performed in visit on 01/05/23  POCT glycosylated hemoglobin (Hb A1C)  Result Value Ref Range   Hemoglobin A1C 5.7 (A) 4.0 - 5.6 %   HbA1c POC (<> result, manual entry)     HbA1c, POC (prediabetic range)     HbA1c, POC (controlled diabetic range)        The 10-year ASCVD risk score (Arnett DK, et al., 2019) is: 8.9%    Assessment & Plan:   Problem List Items Addressed This Visit       Cardiovascular and Mediastinum   HYPERTENSION, BENIGN ESSENTIAL    Blood pressure just a little borderline elevated today.  Will keep an eye on it.  Continue to take medications will make sure that she is got refills.  Will get some updated labs to make sure potassium and renal function look good.      Relevant Medications   losartan-hydrochlorothiazide (HYZAAR)  100-25 MG tablet   Other Relevant Orders   Lipid Panel w/reflex Direct LDL   COMPLETE METABOLIC PANEL WITH GFR   CBC     Endocrine   IFG (impaired fasting glucose) - Primary    A1c looks good and is stable today.  Continue work on [employer] and regular exercise.  Follow-up in 6 months.      Relevant Orders   POCT glycosylated hemoglobin (Hb A1C) (Completed)   Lipid Panel w/reflex Direct LDL   COMPLETE METABOLIC PANEL WITH GFR   CBC     Other   Hyperlipidemia    Due to recheck lipids.  Will make sure adequately controlled.      Relevant Medications   losartan-hydrochlorothiazide (HYZAAR) 100-25 MG tablet   Other Visit Diagnoses     Screening mammogram for breast cancer           Due for mammogram.   She will schedule.  Return in about 6 months (around 07/08/2023) for Hypertension.    Nani Gasser, MD

## 2023-01-05 NOTE — Assessment & Plan Note (Signed)
A1c looks good and is stable today.  Continue work on [employer] and regular exercise.  Follow-up in 6 months.

## 2023-01-05 NOTE — Assessment & Plan Note (Signed)
Blood pressure just a little borderline elevated today.  Will keep an eye on it.  Continue to take medications will make sure that she is got refills.  Will get some updated labs to make sure potassium and renal function look good.

## 2023-02-05 ENCOUNTER — Ambulatory Visit: Payer: Managed Care, Other (non HMO)

## 2023-02-05 DIAGNOSIS — Z1231 Encounter for screening mammogram for malignant neoplasm of breast: Secondary | ICD-10-CM | POA: Diagnosis not present

## 2023-02-10 NOTE — Progress Notes (Signed)
Please call patient. Normal mammogram.  Repeat in 1 year.  

## 2023-07-08 ENCOUNTER — Ambulatory Visit: Payer: Managed Care, Other (non HMO) | Admitting: Family Medicine

## 2023-08-07 ENCOUNTER — Ambulatory Visit (INDEPENDENT_AMBULATORY_CARE_PROVIDER_SITE_OTHER): Payer: Managed Care, Other (non HMO) | Admitting: Family Medicine

## 2023-08-07 ENCOUNTER — Encounter: Payer: Self-pay | Admitting: Family Medicine

## 2023-08-07 VITALS — BP 131/71 | HR 88 | Ht 63.0 in | Wt 203.0 lb

## 2023-08-07 DIAGNOSIS — R7301 Impaired fasting glucose: Secondary | ICD-10-CM | POA: Diagnosis not present

## 2023-08-07 DIAGNOSIS — E785 Hyperlipidemia, unspecified: Secondary | ICD-10-CM | POA: Diagnosis not present

## 2023-08-07 DIAGNOSIS — Z780 Asymptomatic menopausal state: Secondary | ICD-10-CM

## 2023-08-07 DIAGNOSIS — I10 Essential (primary) hypertension: Secondary | ICD-10-CM

## 2023-08-07 MED ORDER — LOSARTAN POTASSIUM-HCTZ 100-25 MG PO TABS
1.0000 | ORAL_TABLET | Freq: Every day | ORAL | 3 refills | Status: DC
Start: 2023-08-07 — End: 2023-12-02

## 2023-08-07 NOTE — Assessment & Plan Note (Signed)
Repeat blood pressure improved.  Will continue to monitor.  Will get updated labs.  Refill sent to pharmacy.

## 2023-08-07 NOTE — Assessment & Plan Note (Signed)
Due to recheck lipids.  Not currently on a statin.

## 2023-08-07 NOTE — Progress Notes (Signed)
   Established Patient Office Visit  Subjective  Patient ID: Shannon [name], female    DOB: August 18, 1956  Age: 67 y.o. MRN: [id number]  Chief Complaint  Patient presents with   Hypertension    HPI  Hypertension- Pt denies chest pain, SOB, dizziness, or heart palpitations.  Taking meds as directed w/o problems.  Denies medication side effects.    Impaired fasting glucose-no increased thirst or urination. No symptoms consistent with hypoglycemia.   She is still working 40 hours per week.    Has started dating someone since 01/31/2023.  His wife passed away about a year before her husband passed away.    ROS    Objective:     BP 131/71   Pulse 88   Ht 5\' 3"  (1.6 m)   Wt 203 lb (92.1 kg)   SpO2 96%   BMI 35.96 kg/m    Physical Exam Vitals and nursing note reviewed.  Constitutional:      Appearance: Normal appearance.  HENT:     Head: Normocephalic and atraumatic.  Eyes:     Conjunctiva/sclera: Conjunctivae normal.  Cardiovascular:     Rate and Rhythm: Normal rate and regular rhythm.  Pulmonary:     Effort: Pulmonary effort is normal.     Breath sounds: Normal breath sounds.  Skin:    General: Skin is warm and dry.  Neurological:     Mental Status: She is alert.  Psychiatric:        Mood and Affect: Mood normal.      No results found for any visits on 08/07/23.    The 10-year ASCVD risk score (Arnett DK, et al., 2019) is: 9%    Assessment & Plan:   Problem List Items Addressed This Visit       Cardiovascular and Mediastinum   HYPERTENSION, BENIGN ESSENTIAL - Primary   Repeat blood pressure improved.  Will continue to monitor.  Will get updated labs.  Refill sent to pharmacy.      Relevant Medications   losartan-hydrochlorothiazide (HYZAAR) 100-25 MG tablet   Other Relevant Orders   CBC   CMP14+EGFR   Lipid Panel With LDL/HDL Ratio   HgB A1c     Endocrine   IFG (impaired fasting glucose)   Due to check A1c.  She says she has been eating a few  more sweets.  But she has still been exercising and staying active still works 40 hours a week.      Relevant Orders   CBC   CMP14+EGFR   Lipid Panel With LDL/HDL Ratio   HgB A1c     Other   Hyperlipidemia   Due to recheck lipids.  Not currently on a statin.      Relevant Medications   losartan-hydrochlorothiazide (HYZAAR) 100-25 MG tablet   Other Relevant Orders   CBC   CMP14+EGFR   Lipid Panel With LDL/HDL Ratio   HgB A1c   Other Visit Diagnoses       Post-menopausal       Relevant Orders   DG Bone Density       Encouraged her think about the shingles vaccine.    Will get DEXA scan updated she already had her mammogram done this year.  Return in about 6 months (around 02/05/2024) for Welcome Medicare Exam .    Nani Gasser, MD

## 2023-08-07 NOTE — Assessment & Plan Note (Signed)
Due to check A1c.  She says she has been eating a few more sweets.  But she has still been exercising and staying active still works 40 hours a week.

## 2023-08-08 LAB — LIPID PANEL WITH LDL/HDL RATIO
Cholesterol, Total: 251 mg/dL — ABNORMAL HIGH (ref 100–199)
HDL: 49 mg/dL (ref >39)
LDL Chol Calc (NIH): 149 mg/dL — ABNORMAL HIGH (ref 0–99)
LDL/HDL Ratio: 3 {ratio} (ref 0.0–3.2)
Triglycerides: 291 mg/dL — ABNORMAL HIGH (ref 0–149)
VLDL Cholesterol Cal: 53 mg/dL — ABNORMAL HIGH (ref 5–40)

## 2023-08-08 LAB — CBC
Hematocrit: 43.7 % (ref 34.0–46.6)
Hemoglobin: 14.3 g/dL (ref 11.1–15.9)
MCH: 32.4 pg (ref 26.6–33.0)
MCHC: 32.7 g/dL (ref 31.5–35.7)
MCV: 99 fL — ABNORMAL HIGH (ref 79–97)
Platelets: 339 10*3/uL (ref 150–450)
RBC: 4.42 x10E6/uL (ref 3.77–5.28)
RDW: 12.4 % (ref 11.7–15.4)
WBC: 7.9 10*3/uL (ref 3.4–10.8)

## 2023-08-08 LAB — CMP14+EGFR
ALT: 22 [IU]/L (ref 0–32)
AST: 19 [IU]/L (ref 0–40)
Albumin: 4.5 g/dL (ref 3.9–4.9)
Alkaline Phosphatase: 85 [IU]/L (ref 44–121)
BUN/Creatinine Ratio: 17 (ref 12–28)
BUN: 19 mg/dL (ref 8–27)
Bilirubin Total: 0.7 mg/dL (ref 0.0–1.2)
CO2: 23 mmol/L (ref 20–29)
Calcium: 9.6 mg/dL (ref 8.7–10.3)
Chloride: 101 mmol/L (ref 96–106)
Creatinine, Ser: 1.13 mg/dL — ABNORMAL HIGH (ref 0.57–1.00)
Globulin, Total: 2.6 g/dL (ref 1.5–4.5)
Glucose: 101 mg/dL — ABNORMAL HIGH (ref 70–99)
Potassium: 3.7 mmol/L (ref 3.5–5.2)
Sodium: 141 mmol/L (ref 134–144)
Total Protein: 7.1 g/dL (ref 6.0–8.5)
eGFR: 54 mL/min/{1.73_m2} — ABNORMAL LOW (ref >59)

## 2023-08-08 LAB — HEMOGLOBIN A1C
Est. average glucose Bld gHb Est-mCnc: 123 mg/dL
Hgb A1c MFr Bld: 5.9 % — ABNORMAL HIGH (ref 4.8–5.6)

## 2023-08-11 NOTE — Progress Notes (Signed)
Call patient: Blood count looks normal no sign of anemia.  Kidney function was up a little bit baseline is around 0.7 this time it was 1.1 which is a little bit unusual for her so would like to recheck that in about 1 week.  Make sure to hydrate well.  Liver function looks great.  LDL cholesterol and triglycerides are high.  Just continue to work on healthy diet and regular exercise.  A1c is 5.9 in the prediabetes range.  Repeat  BMP this week

## 2023-08-12 ENCOUNTER — Other Ambulatory Visit: Payer: Self-pay | Admitting: *Deleted

## 2023-08-12 DIAGNOSIS — I10 Essential (primary) hypertension: Secondary | ICD-10-CM

## 2023-08-13 ENCOUNTER — Telehealth (HOSPITAL_BASED_OUTPATIENT_CLINIC_OR_DEPARTMENT_OTHER): Payer: Self-pay | Admitting: Family Medicine

## 2023-09-02 ENCOUNTER — Other Ambulatory Visit: Payer: Managed Care, Other (non HMO)

## 2023-09-23 ENCOUNTER — Other Ambulatory Visit: Payer: Managed Care, Other (non HMO)

## 2023-10-07 ENCOUNTER — Other Ambulatory Visit: Payer: Managed Care, Other (non HMO)

## 2023-11-25 ENCOUNTER — Telehealth: Payer: Self-pay | Admitting: Family Medicine

## 2023-11-25 NOTE — Telephone Encounter (Signed)
 Tried calling the patient. No answer. Left a brief vm to return a call back to the clinic. Direct call back info provided.

## 2023-11-25 NOTE — Telephone Encounter (Unsigned)
 Copied from CRM [id number]. Topic: Clinical - Medical Advice >> Nov 25, 2023  2:25 PM Alvino Blood C wrote: Reason for CRM: Patient would like to have Dr. Shelah Lewandowsky nurse Archie Patten call her back at [phone]

## 2023-11-26 ENCOUNTER — Telehealth: Payer: Self-pay

## 2023-11-26 ENCOUNTER — Ambulatory Visit: Payer: Self-pay | Admitting: Family Medicine

## 2023-11-26 NOTE — Telephone Encounter (Signed)
 Copied from CRM [id number]. Topic: General - Other >> Nov 25, 2023  5:05 PM Shannon [name] wrote: Reason for CRM: patient wants to know if lab orders are still some good because she was never able to get it done.

## 2023-11-26 NOTE — Telephone Encounter (Signed)
 Message sent to patient via Mychart.

## 2023-11-26 NOTE — Telephone Encounter (Signed)
 Chief Complaint: Chronic back pain  Disposition:  [x] Appointment(In office)  Additional Notes: Upon warm transfer from agent to this RN, pt states "I do not want to talk to a nurse. I want to schedule an appointment with Dr. Linford Arnold." This RN asked pt how long she has had the back pain which she stated a couple years. This RN helped pt to schedule an appointment with PCP tomorrow afternoon. This RN educated pt on new-worsening symptoms and when to call back/seek emergent care. Pt verbalized understanding and agrees to plan.    Copied from CRM [id number]. Topic: Clinical - Red Word Triage >> Nov 26, 2023 11:07 AM Shannon [name] wrote: Red Word that prompted transfer to Nurse Triage: Patient has been having lower back pain, she says its been going on for some time. Reason for Disposition  Back pain is a chronic symptom (recurrent or ongoing AND present > 4 weeks)  Answer Assessment - Initial Assessment Questions 1. ONSET: "When did the pain begin?"      Chronic back pain  Protocols used: Back Pain-A-AH

## 2023-11-27 ENCOUNTER — Ambulatory Visit (INDEPENDENT_AMBULATORY_CARE_PROVIDER_SITE_OTHER): Payer: Self-pay | Admitting: Family Medicine

## 2023-11-27 ENCOUNTER — Ambulatory Visit

## 2023-11-27 ENCOUNTER — Encounter: Payer: Self-pay | Admitting: Family Medicine

## 2023-11-27 ENCOUNTER — Telehealth: Payer: Self-pay | Admitting: Family Medicine

## 2023-11-27 VITALS — BP 128/80 | HR 84 | Ht 63.0 in | Wt 203.8 lb

## 2023-11-27 DIAGNOSIS — G8929 Other chronic pain: Secondary | ICD-10-CM

## 2023-11-27 DIAGNOSIS — M545 Low back pain, unspecified: Secondary | ICD-10-CM

## 2023-11-27 DIAGNOSIS — M2170 Unequal limb length (acquired), unspecified site: Secondary | ICD-10-CM

## 2023-11-27 DIAGNOSIS — M533 Sacrococcygeal disorders, not elsewhere classified: Secondary | ICD-10-CM | POA: Diagnosis not present

## 2023-11-27 MED ORDER — MELOXICAM 15 MG PO TABS: 15.0000 mg | ORAL_TABLET | Freq: Every day | ORAL | 0 refills | Status: DC

## 2023-11-27 NOTE — Progress Notes (Signed)
   Acute Office Visit  Subjective:     Patient ID: Shannon [name], female    DOB: Nov 19, 1955, 68 y.o.   MRN: [id number]  Chief Complaint  Patient presents with   Back Pain    Low mid x yrs    HPI Patient is in today for left  low back pain.  It has been present for years but she is here today because it is getting worse.  I do not see any old films on file within our health system. No known injury.    ROS      Objective:    BP 128/80 (BP Location: Left Arm, Patient Position: Sitting, Cuff Size: Large)   Pulse 84   Ht 5\' 3"  (1.6 m)   Wt 203 lb 12 oz (92.4 kg)   SpO2 96%   BMI 36.09 kg/m    Physical Exam Vitals reviewed.  Constitutional:      Appearance: Normal appearance.  HENT:     Head: Normocephalic.  Pulmonary:     Effort: Pulmonary effort is normal.  Musculoskeletal:     Comments: Normal lumbar flexion.  Pain with rotation to the left.  Pain with extension.  Negative straight leg raise.  Hip, knee, ankle strength 5 out of 5.  Patellar reflexes 1+ bilaterally  Left leg is shorter.    Neurological:     Mental Status: She is alert and oriented to person, place, and time.  Psychiatric:        Mood and Affect: Mood normal.        Behavior: Behavior normal.     No results found for any visits on 11/27/23.      Assessment & Plan:   Problem List Items Addressed This Visit   None Visit Diagnoses       Chronic left-sided low back pain without sciatica    -  Primary   Relevant Medications   meloxicam (MOBIC) 15 MG tablet   Other Relevant Orders   DG Lumbar Spine Complete     Leg length discrepancy       Relevant Orders   DG Lumbar Spine Complete     Chronic SI joint pain       Relevant Medications   meloxicam (MOBIC) 15 MG tablet   Other Relevant Orders   DG Lumbar Spine Complete      Will switch ibuprofen to meloxicam to get little bit longer relief will get updated films since this has been going on for years.  She does have some tenderness  directly over the SI joint so she may benefit from further workup if home stretches are not helping.  Out provided.  Also would like to refer her to get a heel lift put in her shoe.   Meds ordered this encounter  Medications   meloxicam (MOBIC) 15 MG tablet    Sig: Take 1 tablet (15 mg total) by mouth daily.    Dispense:  30 tablet    Refill:  0    No follow-ups on file.  Nani Gasser, MD

## 2023-11-27 NOTE — Telephone Encounter (Signed)
Appointment made today.

## 2023-12-01 ENCOUNTER — Other Ambulatory Visit: Payer: Self-pay | Admitting: Family Medicine

## 2023-12-01 DIAGNOSIS — I10 Essential (primary) hypertension: Secondary | ICD-10-CM

## 2023-12-02 NOTE — Telephone Encounter (Signed)
LVM asking pt to rtn call

## 2023-12-08 ENCOUNTER — Other Ambulatory Visit: Payer: Self-pay | Admitting: Family Medicine

## 2023-12-08 DIAGNOSIS — M2170 Unequal limb length (acquired), unspecified site: Secondary | ICD-10-CM

## 2023-12-08 DIAGNOSIS — G8929 Other chronic pain: Secondary | ICD-10-CM

## 2023-12-08 NOTE — Progress Notes (Signed)
 Call patient: X-ray of the low back shows moderate curvature with degenerative disc disease and some arthritis at the hinge points of the spine.  There is also some arthritis of the sacroiliac joints otherwise known as the SI joints.  The left side looks little worse than the right.  I think next steps would be to either get back in with physical therapy and/or also see sports med to discuss options including that heel lift we had discussed.  Going to definitely go ahead and place the referral but if you are also okay with doing PT then let me know.

## 2023-12-09 ENCOUNTER — Ambulatory Visit

## 2023-12-09 DIAGNOSIS — M810 Age-related osteoporosis without current pathological fracture: Secondary | ICD-10-CM

## 2023-12-09 DIAGNOSIS — Z780 Asymptomatic menopausal state: Secondary | ICD-10-CM

## 2023-12-09 NOTE — Telephone Encounter (Signed)
 Pt advised.

## 2023-12-10 ENCOUNTER — Encounter: Payer: Self-pay | Admitting: Family Medicine

## 2023-12-10 DIAGNOSIS — M810 Age-related osteoporosis without current pathological fracture: Secondary | ICD-10-CM | POA: Insufficient documentation

## 2023-12-10 NOTE — Progress Notes (Signed)
 Call patient: Bone density shows a T-score of -2.7.  This is consistent with osteoporosis.   The current recommendation for osteoporosis treatment includes:   #1 calcium-total of 1200 mg of calcium daily.  If you eat a very calcium rich diet you may be able to obtain that without a supplement.  If not, then I recommend calcium 500 mg twice a day.  There are several products over-the-counter such as Caltrate D and Viactiv chews which are great options that contain calcium and vitamin D. #2 vitamin D-recommend 800 international units daily. #3 exercise-recommend 30 minutes of weightbearing exercise 3 days a week.  Resistance training ,such as doing bands and light weights, can be particularly helpful. #4 medication-if you are not currently on a bone builder, also called a bisphosphonate, then this has been shown to be very helpful in maintaining bone strength, preventing further thinning of the bones, and reducing your risk for fractures.  I would highly recommend that you consider starting 1 of these medications.  If you are okay with that then please let us  know and we will send one to your pharmacy.  If you would like to discuss further we are happy to make an appointment for you so that we can go over options for treatment.\

## 2023-12-22 ENCOUNTER — Ambulatory Visit: Admitting: Sports Medicine

## 2023-12-22 ENCOUNTER — Encounter: Payer: Self-pay | Admitting: Sports Medicine

## 2023-12-22 VITALS — BP 148/90 | Ht 63.0 in | Wt 203.0 lb

## 2023-12-22 DIAGNOSIS — M47818 Spondylosis without myelopathy or radiculopathy, sacral and sacrococcygeal region: Secondary | ICD-10-CM

## 2023-12-22 NOTE — Progress Notes (Signed)
   Subjective:    Patient ID: Shannon [name], female    DOB: [birth year], 68 y.o.   MRN: [id number]  HPI  Chief complaint: Low back pain  Patient is a very pleasant 68 year old female that presents today with acute on chronic left-sided low back pain.  She has had pain on and off for years.  She works in a Clinical research associate at [employer] and primarily has pain with standing for long periods of time on uneven surfaces.  She also has a diagnosis of a leg length discrepancy but does not currently have any sort of orthotic.  She localizes her pain at the left SI joint.  Pain does not radiate into the leg.  No numbness or tingling.  No weakness.  No prior low back surgeries.  Recent x-rays of her lumbar spine showed some diffuse degenerative changes throughout the spine as well as degenerative changes of the SI joints, left greater than right.  She was prescribed meloxicam  by her PCP and this has been helpful on the days that she is not working.  Past medical history reviewed Medications reviewed Allergies reviewed  Review of Systems As above    Objective:   Physical Exam  Well-developed, well-nourished.  No acute distress  Lumbar spine: There is tenderness to palpation directly over the SI joint but no pain with FABER testing.  Negative straight leg raise.  Good lumbar range of motion.  No pain with flexion or extension.  No gross focal neurological deficit of either lower extremity.  There is a noticeable leg length discrepancy, left leg shorter than the right.  X-rays of the lumbar spine are as above      Assessment & Plan:   Chronic low back pain likely secondary to left SI joint degenerative changes Leg length discrepancy  A referral to [facility] for custom orthotics to correct her leg length discrepancy is placed.  She will continue on her meloxicam  as needed for pain as well.  She understands that this is a chronic ongoing issue without an easy fix.  If her pain worsens we could  consider referral to [facility] imaging for a fluoroscopically guided SI joint injection.  She will follow-up as needed.  This note was dictated using Dragon naturally speaking software and may contain errors in syntax, spelling, or content which have not been identified prior to signing this note.

## 2023-12-25 ENCOUNTER — Encounter: Payer: Self-pay | Admitting: *Deleted

## 2024-02-05 ENCOUNTER — Encounter: Payer: Managed Care, Other (non HMO) | Admitting: Family Medicine

## 2024-05-19 DIAGNOSIS — M9903 Segmental and somatic dysfunction of lumbar region: Secondary | ICD-10-CM | POA: Diagnosis not present

## 2024-05-19 DIAGNOSIS — M4125 Other idiopathic scoliosis, thoracolumbar region: Secondary | ICD-10-CM | POA: Diagnosis not present

## 2024-05-19 DIAGNOSIS — M9901 Segmental and somatic dysfunction of cervical region: Secondary | ICD-10-CM | POA: Diagnosis not present

## 2024-05-19 DIAGNOSIS — M545 Low back pain, unspecified: Secondary | ICD-10-CM | POA: Diagnosis not present

## 2024-05-19 DIAGNOSIS — M9902 Segmental and somatic dysfunction of thoracic region: Secondary | ICD-10-CM | POA: Diagnosis not present

## 2024-05-19 DIAGNOSIS — M9904 Segmental and somatic dysfunction of sacral region: Secondary | ICD-10-CM | POA: Diagnosis not present

## 2024-05-23 DIAGNOSIS — M9904 Segmental and somatic dysfunction of sacral region: Secondary | ICD-10-CM | POA: Diagnosis not present

## 2024-05-23 DIAGNOSIS — M9901 Segmental and somatic dysfunction of cervical region: Secondary | ICD-10-CM | POA: Diagnosis not present

## 2024-05-23 DIAGNOSIS — M545 Low back pain, unspecified: Secondary | ICD-10-CM | POA: Diagnosis not present

## 2024-05-23 DIAGNOSIS — M9902 Segmental and somatic dysfunction of thoracic region: Secondary | ICD-10-CM | POA: Diagnosis not present

## 2024-05-23 DIAGNOSIS — M9903 Segmental and somatic dysfunction of lumbar region: Secondary | ICD-10-CM | POA: Diagnosis not present

## 2024-05-23 DIAGNOSIS — M4125 Other idiopathic scoliosis, thoracolumbar region: Secondary | ICD-10-CM | POA: Diagnosis not present

## 2024-06-02 DIAGNOSIS — M9904 Segmental and somatic dysfunction of sacral region: Secondary | ICD-10-CM | POA: Diagnosis not present

## 2024-06-02 DIAGNOSIS — M545 Low back pain, unspecified: Secondary | ICD-10-CM | POA: Diagnosis not present

## 2024-06-02 DIAGNOSIS — M9902 Segmental and somatic dysfunction of thoracic region: Secondary | ICD-10-CM | POA: Diagnosis not present

## 2024-06-02 DIAGNOSIS — M9903 Segmental and somatic dysfunction of lumbar region: Secondary | ICD-10-CM | POA: Diagnosis not present

## 2024-06-02 DIAGNOSIS — M4125 Other idiopathic scoliosis, thoracolumbar region: Secondary | ICD-10-CM | POA: Diagnosis not present

## 2024-06-02 DIAGNOSIS — M9901 Segmental and somatic dysfunction of cervical region: Secondary | ICD-10-CM | POA: Diagnosis not present

## 2024-06-09 DIAGNOSIS — M9902 Segmental and somatic dysfunction of thoracic region: Secondary | ICD-10-CM | POA: Diagnosis not present

## 2024-06-09 DIAGNOSIS — M9904 Segmental and somatic dysfunction of sacral region: Secondary | ICD-10-CM | POA: Diagnosis not present

## 2024-06-09 DIAGNOSIS — M545 Low back pain, unspecified: Secondary | ICD-10-CM | POA: Diagnosis not present

## 2024-06-09 DIAGNOSIS — M9901 Segmental and somatic dysfunction of cervical region: Secondary | ICD-10-CM | POA: Diagnosis not present

## 2024-06-09 DIAGNOSIS — M4125 Other idiopathic scoliosis, thoracolumbar region: Secondary | ICD-10-CM | POA: Diagnosis not present

## 2024-06-09 DIAGNOSIS — M9903 Segmental and somatic dysfunction of lumbar region: Secondary | ICD-10-CM | POA: Diagnosis not present

## 2024-06-23 DIAGNOSIS — M9904 Segmental and somatic dysfunction of sacral region: Secondary | ICD-10-CM | POA: Diagnosis not present

## 2024-06-23 DIAGNOSIS — M9902 Segmental and somatic dysfunction of thoracic region: Secondary | ICD-10-CM | POA: Diagnosis not present

## 2024-06-23 DIAGNOSIS — M4125 Other idiopathic scoliosis, thoracolumbar region: Secondary | ICD-10-CM | POA: Diagnosis not present

## 2024-06-23 DIAGNOSIS — M9903 Segmental and somatic dysfunction of lumbar region: Secondary | ICD-10-CM | POA: Diagnosis not present

## 2024-06-23 DIAGNOSIS — M9901 Segmental and somatic dysfunction of cervical region: Secondary | ICD-10-CM | POA: Diagnosis not present

## 2024-06-23 DIAGNOSIS — M545 Low back pain, unspecified: Secondary | ICD-10-CM | POA: Diagnosis not present

## 2024-06-28 ENCOUNTER — Ambulatory Visit: Admitting: Family Medicine

## 2024-06-28 ENCOUNTER — Encounter: Payer: Self-pay | Admitting: Family Medicine

## 2024-06-28 VITALS — BP 122/80 | HR 83 | Ht 63.0 in | Wt 197.2 lb

## 2024-06-28 DIAGNOSIS — M810 Age-related osteoporosis without current pathological fracture: Secondary | ICD-10-CM | POA: Diagnosis not present

## 2024-06-28 DIAGNOSIS — Z23 Encounter for immunization: Secondary | ICD-10-CM | POA: Diagnosis not present

## 2024-06-28 DIAGNOSIS — Z1211 Encounter for screening for malignant neoplasm of colon: Secondary | ICD-10-CM | POA: Diagnosis not present

## 2024-06-28 DIAGNOSIS — I10 Essential (primary) hypertension: Secondary | ICD-10-CM

## 2024-06-28 DIAGNOSIS — G4452 New daily persistent headache (NDPH): Secondary | ICD-10-CM | POA: Diagnosis not present

## 2024-06-28 DIAGNOSIS — R7301 Impaired fasting glucose: Secondary | ICD-10-CM

## 2024-06-28 DIAGNOSIS — M9902 Segmental and somatic dysfunction of thoracic region: Secondary | ICD-10-CM | POA: Diagnosis not present

## 2024-06-28 DIAGNOSIS — M9901 Segmental and somatic dysfunction of cervical region: Secondary | ICD-10-CM | POA: Diagnosis not present

## 2024-06-28 DIAGNOSIS — M545 Low back pain, unspecified: Secondary | ICD-10-CM | POA: Diagnosis not present

## 2024-06-28 DIAGNOSIS — M9904 Segmental and somatic dysfunction of sacral region: Secondary | ICD-10-CM | POA: Diagnosis not present

## 2024-06-28 DIAGNOSIS — M9903 Segmental and somatic dysfunction of lumbar region: Secondary | ICD-10-CM | POA: Diagnosis not present

## 2024-06-28 DIAGNOSIS — M4125 Other idiopathic scoliosis, thoracolumbar region: Secondary | ICD-10-CM | POA: Diagnosis not present

## 2024-06-28 MED ORDER — LOSARTAN POTASSIUM-HCTZ 100-25 MG PO TABS: 1.0000 | ORAL_TABLET | Freq: Every day | ORAL | 3 refills | Status: AC

## 2024-06-28 NOTE — Progress Notes (Signed)
 Established Patient Office Visit  Patient ID: Shannon [name], female    DOB: 12/12/1955  Age: 68 y.o. MRN: [id number] PCP: Alvan Dorothyann BIRCH, MD  Chief Complaint  Patient presents with   Medical Management of Chronic Issues    Subjective:     HPI  Discussed the use of AI scribe software for clinical note transcription with the patient, who gave verbal consent to proceed.  History of Present Illness Shannon [name] is a 68 year old female who presents with daily morning headaches.  Cephalgia - Daily morning headaches occurring almost every day - Headaches primarily present upon waking - Symptoms typically resolve within one hour of getting up and moving around - Attributes headaches to sleeping position or pillow, often waking in unusual positions - Chiropractic care has provided relief - feels HA have now resolved - hip is better as well  Metabolic risk factors - Family history of diabetes on maternal side - Previously in prediabetes range   She officially retired from [employer] last week. May get a part time job later.    Here for f/u BP and IFG>      ROS    Objective:     BP 122/80   Pulse 83   Ht 5' 3 (1.6 m)   Wt 197 lb 3.2 oz (89.4 kg)   SpO2 99%   BMI 34.93 kg/m    Physical Exam Vitals and nursing note reviewed.  Constitutional:      Appearance: Normal appearance.  HENT:     Head: Normocephalic and atraumatic.  Eyes:     Conjunctiva/sclera: Conjunctivae normal.  Cardiovascular:     Rate and Rhythm: Normal rate and regular rhythm.  Pulmonary:     Effort: Pulmonary effort is normal.     Breath sounds: Normal breath sounds.  Skin:    General: Skin is warm and dry.  Neurological:     Mental Status: She is alert.  Psychiatric:        Mood and Affect: Mood normal.      No results found for any visits on 06/28/24.    The 10-year ASCVD risk score (Arnett DK, et al., 2019) is: 8%    Assessment & Plan:   Problem List  Items Addressed This Visit       Cardiovascular and Mediastinum   HYPERTENSION, BENIGN ESSENTIAL   Essential Hypertension Blood pressure well-controlled on current medication. - Continue current medication regimen.      Relevant Medications   losartan -hydrochlorothiazide  (HYZAAR) 100-25 MG tablet   Other Relevant Orders   CBC with Differential   HgB A1c   CMP14+EGFR   Lipid Panel With LDL/HDL Ratio   TSH   VITAMIN D 25 Hydroxy (Vit-D Deficiency, Fractures)     Endocrine   IFG (impaired fasting glucose)   Prediabetes Family history of diabetes. Previous A1c in prediabetes range. Headaches possibly related to blood sugar levels. - Ordered A1c test.      Relevant Orders   CBC with Differential   HgB A1c   CMP14+EGFR   Lipid Panel With LDL/HDL Ratio   TSH   VITAMIN D 25 Hydroxy (Vit-D Deficiency, Fractures)     Musculoskeletal and Integument   Osteoporosis   Relevant Orders   CBC with Differential   HgB A1c   CMP14+EGFR   Lipid Panel With LDL/HDL Ratio   TSH   VITAMIN D 25 Hydroxy (Vit-D Deficiency, Fractures)   Other Visit Diagnoses       Encounter for  immunization    -  Primary   Relevant Orders   Flu vaccine HIGH DOSE PF(Fluzone Trivalent) (Completed)     Screen for colon cancer       Relevant Orders   Ambulatory referral to Gastroenterology     New persistent daily headache           Assessment and Plan Assessment & Plan  Headaches Daily headaches, likely cervical origin, resolving within an hour. Chiropractic treatment beneficial.  General Health Maintenance Discussed flu vaccination and colon cancer screening. She is fully retired and plans part-time work. - Administered flu shot. - Referred for colonoscopy in January.    Return in about 6 months (around 12/26/2024) for Hypertension.    Dorothyann Byars, MD [facility] at [facility]

## 2024-06-28 NOTE — Patient Instructions (Signed)
HAPPY EARLY BIRTHDAY!!!

## 2024-06-28 NOTE — Assessment & Plan Note (Signed)
 Essential Hypertension Blood pressure well-controlled on current medication. - Continue current medication regimen.

## 2024-06-28 NOTE — Assessment & Plan Note (Signed)
 Prediabetes Family history of diabetes. Previous A1c in prediabetes range. Headaches possibly related to blood sugar levels. - Ordered A1c test.

## 2024-06-28 NOTE — Progress Notes (Signed)
 Pt reports that she was experiencing daily headaches but since she scheduled the appointment she was seen by the Chiropractor and he did some adjustments and hasn't had anymore headaches since.

## 2024-06-29 ENCOUNTER — Ambulatory Visit: Payer: Self-pay | Admitting: Family Medicine

## 2024-06-29 LAB — CBC WITH DIFFERENTIAL/PLATELET
Basophils Absolute: 0.1 x10E3/uL (ref 0.0–0.2)
Basos: 1 %
EOS (ABSOLUTE): 0.1 x10E3/uL (ref 0.0–0.4)
Eos: 1 %
Hematocrit: 44 % (ref 34.0–46.6)
Hemoglobin: 14.6 g/dL (ref 11.1–15.9)
Immature Grans (Abs): 0 x10E3/uL (ref 0.0–0.1)
Immature Granulocytes: 0 %
Lymphocytes Absolute: 3.2 x10E3/uL — ABNORMAL HIGH (ref 0.7–3.1)
Lymphs: 40 %
MCH: 32.6 pg (ref 26.6–33.0)
MCHC: 33.2 g/dL (ref 31.5–35.7)
MCV: 98 fL — ABNORMAL HIGH (ref 79–97)
Monocytes Absolute: 0.8 x10E3/uL (ref 0.1–0.9)
Monocytes: 10 %
Neutrophils Absolute: 3.9 x10E3/uL (ref 1.4–7.0)
Neutrophils: 48 %
Platelets: 335 x10E3/uL (ref 150–450)
RBC: 4.48 x10E6/uL (ref 3.77–5.28)
RDW: 12.8 % (ref 11.7–15.4)
WBC: 8.2 x10E3/uL (ref 3.4–10.8)

## 2024-06-29 LAB — CMP14+EGFR
ALT: 17 IU/L (ref 0–32)
AST: 20 IU/L (ref 0–40)
Albumin: 4.6 g/dL (ref 3.9–4.9)
Alkaline Phosphatase: 70 IU/L (ref 49–135)
BUN/Creatinine Ratio: 21 (ref 12–28)
BUN: 17 mg/dL (ref 8–27)
Bilirubin Total: 1.1 mg/dL (ref 0.0–1.2)
CO2: 21 mmol/L (ref 20–29)
Calcium: 9.6 mg/dL (ref 8.7–10.3)
Chloride: 102 mmol/L (ref 96–106)
Creatinine, Ser: 0.82 mg/dL (ref 0.57–1.00)
Globulin, Total: 2.5 g/dL (ref 1.5–4.5)
Glucose: 95 mg/dL (ref 70–99)
Potassium: 3.8 mmol/L (ref 3.5–5.2)
Sodium: 139 mmol/L (ref 134–144)
Total Protein: 7.1 g/dL (ref 6.0–8.5)
eGFR: 79 mL/min/1.73 (ref >59)

## 2024-06-29 LAB — HEMOGLOBIN A1C
Est. average glucose Bld gHb Est-mCnc: 117 mg/dL
Hgb A1c MFr Bld: 5.7 % — ABNORMAL HIGH (ref 4.8–5.6)

## 2024-06-29 LAB — LIPID PANEL WITH LDL/HDL RATIO
Cholesterol, Total: 248 mg/dL — ABNORMAL HIGH (ref 100–199)
HDL: 47 mg/dL (ref >39)
LDL Chol Calc (NIH): 149 mg/dL — ABNORMAL HIGH (ref 0–99)
LDL/HDL Ratio: 3.2 ratio (ref 0.0–3.2)
Triglycerides: 285 mg/dL — ABNORMAL HIGH (ref 0–149)
VLDL Cholesterol Cal: 52 mg/dL — ABNORMAL HIGH (ref 5–40)

## 2024-06-29 LAB — TSH: TSH: 1.25 u[IU]/mL (ref 0.450–4.500)

## 2024-06-29 LAB — VITAMIN D 25 HYDROXY (VIT D DEFICIENCY, FRACTURES): Vit D, 25-Hydroxy: 30.9 ng/mL (ref 30.0–100.0)

## 2024-06-29 NOTE — Progress Notes (Signed)
 Call patient: Hemoglobin looks good no sign of anemia.  A1c looks great at 5.7 she has done a great job in improving that number continue to work on [employer] and regular exercise.  Triglycerides are still elevated as well as LDL so again just continue to work on those healthy food choices.  Metabolic panel looks good including kidney function which looks like it is back to normal and back to baseline.  Thyroid level looks perfect at 1.2.  Vitamin D is still on the low end of normal so please just make sure you are taking 25 to 50 mcg daily.  Especially through the fall, winter, and early spring months.

## 2024-06-30 NOTE — Telephone Encounter (Signed)
 Copied from CRM [id number]. Topic: Clinical - Lab/Test Results >> Jun 30, 2024 12:05 PM Selinda RAMAN wrote: Reason for CRM: The patient called in returning a call to [facility] concerning her lab results. The office goes to lunch from 12-1 so  I was unable to try and get a hold of Kim. Please assist patient further. She said she will call back to ask for Luke sometime after 1 to go over those results.

## 2024-09-29 ENCOUNTER — Ambulatory Visit: Payer: Self-pay

## 2024-09-29 NOTE — Telephone Encounter (Signed)
 FYI Only or Action Required?: Action required by provider: request for appointment.  Patient was last seen in primary care on 06/28/2024 by Alvan Dorothyann BIRCH, MD.  Called Nurse Triage reporting Chest Pain.  Symptoms began several years ago.  Interventions attempted: Nothing.  Symptoms are: gradually worsening. Increased frequency  Triage Disposition: See Physician Within 24 Hours  Patient/caregiver understands and will follow disposition?: No, wishes to speak with PCP  Reason for Triage: patient periodically gets a pain in her chest. she said its not severe but would like to get it looked at   Reason for Disposition  [1] Chest pain lasts < 5 minutes AND [2] NO chest pain or cardiac symptoms (e.g., breathing difficulty, sweating) now  (Exception: Chest pains that last only a few seconds.)  Answer Assessment - Initial Assessment Questions 1. LOCATION: Where does it hurt?       CP 2. RADIATION: Does the pain go anywhere else? (e.g., into neck, jaw, arms, back)     denies 3. ONSET: When did the chest pain begin? (Minutes, hours or days)      Years ago 4. PATTERN: Does the pain come and go, or has it been constant since it started?  Does it get worse with exertion?      intermittent 5. DURATION: How long does it last (e.g., seconds, minutes, hours)     <5 minutes 9. CAUSE: What do you think is causing the chest pain?     unsure 10. OTHER SYMPTOMS: Do you have any other symptoms? (e.g., dizziness, nausea, vomiting, sweating, fever, difficulty breathing, cough)       denies  Pt refusing to see other providers, pt refuses appt with PCP today d/t another previously scheduled appt, only wants to see PCP. Pt states she can wait until March for doctors next appt, routing to clinic for scheduling options.  Protocols used: Chest Pain-A-AH

## 2024-09-29 NOTE — Telephone Encounter (Signed)
 Attempted to contact patient but unfortunately was sent to voicemail. I have left a voice message requesting a call back to our office.

## 2024-10-03 ENCOUNTER — Ambulatory Visit: Admitting: Family Medicine

## 2024-12-27 ENCOUNTER — Ambulatory Visit: Admitting: Family Medicine
